# Patient Record
Sex: Male | Born: 1975 | Race: White | Hispanic: No | Marital: Single | Smoking: Never smoker
Health system: Southern US, Community
[De-identification: ages and names within clinical notes are randomized; demographics above are authoritative.]

## PROBLEM LIST (undated history)

## (undated) DIAGNOSIS — Z789 Other specified health status: Secondary | ICD-10-CM

## (undated) HISTORY — PX: NO PAST SURGERIES: SHX2092

---

## 1998-09-07 ENCOUNTER — Emergency Department (HOSPITAL_COMMUNITY): Admission: EM | Admit: 1998-09-07 | Discharge: 1998-09-07 | Payer: Self-pay

## 1998-09-07 ENCOUNTER — Encounter: Payer: Self-pay | Admitting: Emergency Medicine

## 2002-03-18 ENCOUNTER — Emergency Department (HOSPITAL_COMMUNITY): Admission: EM | Admit: 2002-03-18 | Discharge: 2002-03-19 | Payer: Self-pay | Admitting: Emergency Medicine

## 2011-04-24 ENCOUNTER — Emergency Department (HOSPITAL_COMMUNITY)
Admission: EM | Admit: 2011-04-24 | Discharge: 2011-04-24 | Disposition: A | Discharge status: Home / Self Care | Payer: Self-pay | Attending: Emergency Medicine | Admitting: Emergency Medicine

## 2011-04-24 DIAGNOSIS — K029 Dental caries, unspecified: Secondary | ICD-10-CM | POA: Insufficient documentation

## 2011-04-24 DIAGNOSIS — I10 Essential (primary) hypertension: Secondary | ICD-10-CM | POA: Insufficient documentation

## 2011-04-24 DIAGNOSIS — R220 Localized swelling, mass and lump, head: Secondary | ICD-10-CM | POA: Insufficient documentation

## 2011-04-24 DIAGNOSIS — K089 Disorder of teeth and supporting structures, unspecified: Secondary | ICD-10-CM | POA: Insufficient documentation

## 2012-08-24 ENCOUNTER — Inpatient Hospital Stay (HOSPITAL_COMMUNITY)
Admission: EM | Admit: 2012-08-24 | Discharge: 2012-08-28 | DRG: 465 | Disposition: A | Discharge status: Rehabilitation Facility | Payer: No Typology Code available for payment source | Attending: Orthopaedic Surgery | Admitting: Orthopaedic Surgery

## 2012-08-24 ENCOUNTER — Emergency Department (HOSPITAL_COMMUNITY): Payer: No Typology Code available for payment source

## 2012-08-24 DIAGNOSIS — T07XXXA Unspecified multiple injuries, initial encounter: Secondary | ICD-10-CM

## 2012-08-24 DIAGNOSIS — S82201B Unspecified fracture of shaft of right tibia, initial encounter for open fracture type I or II: Secondary | ICD-10-CM

## 2012-08-24 DIAGNOSIS — S82209B Unspecified fracture of shaft of unspecified tibia, initial encounter for open fracture type I or II: Principal | ICD-10-CM | POA: Diagnosis present

## 2012-08-24 HISTORY — DX: Other specified health status: Z78.9

## 2012-08-24 LAB — CG4 I-STAT (LACTIC ACID): Lactic Acid, Venous: 3.1 mmol/L — ABNORMAL HIGH (ref 0.5–2.2)

## 2012-08-24 MED ORDER — ONDANSETRON HCL 4 MG/2ML IJ SOLN
4.0000 mg | Freq: Once | INTRAMUSCULAR | Status: AC
  Administered 2012-08-24: 4 mg via INTRAVENOUS

## 2012-08-24 MED ORDER — ONDANSETRON HCL 4 MG/2ML IJ SOLN
INTRAMUSCULAR | Status: AC
  Filled 2012-08-24: qty 2

## 2012-08-24 MED ORDER — FENTANYL CITRATE 0.05 MG/ML IJ SOLN
100.0000 ug | Freq: Once | INTRAMUSCULAR | Status: AC
  Administered 2012-08-24: 100 ug via INTRAVENOUS

## 2012-08-24 MED ORDER — SODIUM CHLORIDE 0.9 % IV BOLUS (SEPSIS)
1000.0000 mL | Freq: Once | INTRAVENOUS | Status: AC
  Administered 2012-08-24: 1000 mL via INTRAVENOUS

## 2012-08-24 MED ORDER — FENTANYL CITRATE 0.05 MG/ML IJ SOLN
INTRAMUSCULAR | Status: AC
  Filled 2012-08-24: qty 2
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
## 2012-08-25 ENCOUNTER — Emergency Department (HOSPITAL_COMMUNITY): Payer: No Typology Code available for payment source

## 2012-08-25 ENCOUNTER — Encounter (HOSPITAL_COMMUNITY): Payer: Self-pay | Admitting: Anesthesiology

## 2012-08-25 ENCOUNTER — Encounter (HOSPITAL_COMMUNITY): Payer: Self-pay | Admitting: Radiology

## 2012-08-25 ENCOUNTER — Emergency Department (HOSPITAL_COMMUNITY): Payer: No Typology Code available for payment source | Admitting: Anesthesiology

## 2012-08-25 ENCOUNTER — Encounter (HOSPITAL_COMMUNITY)
Admission: EM | Disposition: A | Discharge status: Rehabilitation Facility | Payer: Self-pay | Source: Home / Self Care | Attending: Orthopaedic Surgery

## 2012-08-25 DIAGNOSIS — S82201B Unspecified fracture of shaft of right tibia, initial encounter for open fracture type I or II: Secondary | ICD-10-CM

## 2012-08-25 HISTORY — PX: TIBIA IM NAIL INSERTION: SHX2516

## 2012-08-25 LAB — COMPREHENSIVE METABOLIC PANEL
ALT: 48 U/L (ref 0–53)
AST: 39 U/L — ABNORMAL HIGH (ref 0–37)
Albumin: 4.1 g/dL (ref 3.5–5.2)
Alkaline Phosphatase: 81 U/L (ref 39–117)
CO2: 26 mEq/L (ref 19–32)
Chloride: 99 mEq/L (ref 96–112)
GFR calc non Af Amer: 65 mL/min — ABNORMAL LOW (ref >90)
Potassium: 4 mEq/L (ref 3.5–5.1)
Sodium: 138 mEq/L (ref 135–145)
Total Bilirubin: 0.4 mg/dL (ref 0.3–1.2)

## 2012-08-25 LAB — POCT I-STAT, CHEM 8
BUN: 21 mg/dL (ref 6–23)
Calcium, Ion: 1.07 mmol/L — ABNORMAL LOW (ref 1.12–1.23)
Chloride: 101 mEq/L (ref 96–112)
Creatinine, Ser: 1.4 mg/dL — ABNORMAL HIGH (ref 0.50–1.35)
TCO2: 27 mmol/L (ref 0–100)

## 2012-08-25 LAB — CDS SEROLOGY

## 2012-08-25 LAB — CBC
MCH: 28.5 pg (ref 26.0–34.0)
MCHC: 32.8 g/dL (ref 30.0–36.0)
MCV: 86.8 fL (ref 78.0–100.0)
Platelets: 224 10*3/uL (ref 150–400)

## 2012-08-25 LAB — PROTIME-INR: Prothrombin Time: 12.6 seconds (ref 11.6–15.2)

## 2012-08-25 SURGERY — INSERTION, INTRAMEDULLARY ROD, TIBIA
Anesthesia: General | Site: Leg Lower | Laterality: Right | Wound class: Dirty or Infected

## 2012-08-25 MED ORDER — BUPIVACAINE HCL (PF) 0.5 % IJ SOLN
INTRAMUSCULAR | Status: AC
  Filled 2012-08-25: qty 30

## 2012-08-25 MED ORDER — NEOSTIGMINE METHYLSULFATE 1 MG/ML IJ SOLN
INTRAMUSCULAR | Status: DC | PRN
  Administered 2012-08-25: 5 mg via INTRAVENOUS

## 2012-08-25 MED ORDER — KETOROLAC TROMETHAMINE 30 MG/ML IJ SOLN
INTRAMUSCULAR | Status: AC
  Filled 2012-08-25: qty 1

## 2012-08-25 MED ORDER — HYDROMORPHONE HCL PF 1 MG/ML IJ SOLN
1.0000 mg | INTRAMUSCULAR | Status: DC | PRN
  Administered 2012-08-25: 1 mg via INTRAVENOUS

## 2012-08-25 MED ORDER — HYDROMORPHONE HCL PF 1 MG/ML IJ SOLN
INTRAMUSCULAR | Status: AC
  Filled 2012-08-25: qty 1

## 2012-08-25 MED ORDER — WARFARIN SODIUM 10 MG PO TABS
10.0000 mg | ORAL_TABLET | Freq: Once | ORAL | Status: DC
  Filled 2012-08-25: qty 1

## 2012-08-25 MED ORDER — SUCCINYLCHOLINE CHLORIDE 20 MG/ML IJ SOLN
INTRAMUSCULAR | Status: DC | PRN
  Administered 2012-08-25: 140 mg via INTRAVENOUS

## 2012-08-25 MED ORDER — FENTANYL CITRATE 0.05 MG/ML IJ SOLN
100.0000 ug | Freq: Once | INTRAMUSCULAR | Status: AC
  Administered 2012-08-25: 100 ug via INTRAVENOUS
  Filled 2012-08-25: qty 2

## 2012-08-25 MED ORDER — ONDANSETRON HCL 4 MG/2ML IJ SOLN: 4.0000 mg | Freq: Four times a day (QID) | INTRAMUSCULAR | Status: DC | PRN

## 2012-08-25 MED ORDER — IOHEXOL 300 MG/ML  SOLN
100.0000 mL | Freq: Once | INTRAMUSCULAR | Status: AC | PRN
  Administered 2012-08-25: 100 mL via INTRAVENOUS

## 2012-08-25 MED ORDER — FENTANYL CITRATE 0.05 MG/ML IJ SOLN
INTRAMUSCULAR | Status: DC | PRN
  Administered 2012-08-25: 50 ug via INTRAVENOUS
  Administered 2012-08-25 (×2): 100 ug via INTRAVENOUS

## 2012-08-25 MED ORDER — DEXTROSE 5 % IV SOLN
500.0000 mg | Freq: Four times a day (QID) | INTRAVENOUS | Status: DC | PRN
  Filled 2012-08-25: qty 5

## 2012-08-25 MED ORDER — TETANUS-DIPHTH-ACELL PERTUSSIS 5-2.5-18.5 LF-MCG/0.5 IM SUSP
0.5000 mL | Freq: Once | INTRAMUSCULAR | Status: AC
  Administered 2012-08-25: 0.5 mL via INTRAMUSCULAR
  Filled 2012-08-25: qty 0.5

## 2012-08-25 MED ORDER — ONDANSETRON HCL 4 MG/2ML IJ SOLN
INTRAMUSCULAR | Status: DC | PRN
  Administered 2012-08-25: 4 mg via INTRAVENOUS

## 2012-08-25 MED ORDER — SODIUM CHLORIDE 0.9 % IR SOLN: Status: DC | PRN

## 2012-08-25 MED ORDER — WARFARIN SODIUM 10 MG PO TABS
10.0000 mg | ORAL_TABLET | Freq: Once | ORAL | Status: AC
  Administered 2012-08-25: 10 mg via ORAL
  Filled 2012-08-25: qty 1

## 2012-08-25 MED ORDER — INFLUENZA VIRUS VACC SPLIT PF IM SUSP
0.5000 mL | INTRAMUSCULAR | Status: AC
  Filled 2012-08-25: qty 0.5

## 2012-08-25 MED ORDER — CEFAZOLIN SODIUM 1-5 GM-% IV SOLN
1.0000 g | Freq: Once | INTRAVENOUS | Status: AC
  Administered 2012-08-25: 1 g via INTRAVENOUS
  Filled 2012-08-25: qty 50

## 2012-08-25 MED ORDER — HYDROMORPHONE BOLUS VIA INFUSION: 1.0000 mg | INTRAVENOUS | Status: DC | PRN

## 2012-08-25 MED ORDER — DEXTROSE 5 % IV SOLN
INTRAVENOUS | Status: DC | PRN
  Administered 2012-08-25: 04:00:00 via INTRAVENOUS

## 2012-08-25 MED ORDER — METOCLOPRAMIDE HCL 5 MG/ML IJ SOLN: 5.0000 mg | Freq: Three times a day (TID) | INTRAMUSCULAR | Status: DC | PRN

## 2012-08-25 MED ORDER — CEFAZOLIN SODIUM-DEXTROSE 2-3 GM-% IV SOLR
2.0000 g | Freq: Four times a day (QID) | INTRAVENOUS | Status: AC
  Administered 2012-08-25 – 2012-08-27 (×8): 2 g via INTRAVENOUS
  Filled 2012-08-25 (×8): qty 50

## 2012-08-25 MED ORDER — KETOROLAC TROMETHAMINE 30 MG/ML IJ SOLN
30.0000 mg | Freq: Once | INTRAMUSCULAR | Status: AC
  Administered 2012-08-25: 30 mg via INTRAVENOUS

## 2012-08-25 MED ORDER — CEFAZOLIN SODIUM 1-5 GM-% IV SOLN
INTRAVENOUS | Status: AC
  Filled 2012-08-25: qty 100

## 2012-08-25 MED ORDER — METOCLOPRAMIDE HCL 10 MG PO TABS: 5.0000 mg | ORAL_TABLET | Freq: Three times a day (TID) | ORAL | Status: DC | PRN

## 2012-08-25 MED ORDER — LIDOCAINE HCL (CARDIAC) 20 MG/ML IV SOLN
INTRAVENOUS | Status: DC | PRN
  Administered 2012-08-25: 100 mg via INTRAVENOUS

## 2012-08-25 MED ORDER — WARFARIN VIDEO: 1.0000 | Freq: Once | Status: DC

## 2012-08-25 MED ORDER — HYDROMORPHONE HCL PF 1 MG/ML IJ SOLN: 0.5000 mg | INTRAMUSCULAR | Status: DC | PRN

## 2012-08-25 MED ORDER — BUPIVACAINE-EPINEPHRINE 0.5% -1:200000 IJ SOLN
INTRAMUSCULAR | Status: DC | PRN
  Administered 2012-08-25: 7 mL

## 2012-08-25 MED ORDER — DOCUSATE SODIUM 100 MG PO CAPS
100.0000 mg | ORAL_CAPSULE | Freq: Two times a day (BID) | ORAL | Status: DC
  Administered 2012-08-25 – 2012-08-28 (×7): 100 mg via ORAL
  Filled 2012-08-25 (×9): qty 1

## 2012-08-25 MED ORDER — GLYCOPYRROLATE 0.2 MG/ML IJ SOLN
INTRAMUSCULAR | Status: DC | PRN
  Administered 2012-08-25: .6 mg via INTRAVENOUS

## 2012-08-25 MED ORDER — ROCURONIUM BROMIDE 100 MG/10ML IV SOLN
INTRAVENOUS | Status: DC | PRN
  Administered 2012-08-25: 20 mg via INTRAVENOUS
  Administered 2012-08-25: 10 mg via INTRAVENOUS
  Administered 2012-08-25: 50 mg via INTRAVENOUS

## 2012-08-25 MED ORDER — HYDROMORPHONE HCL PF 1 MG/ML IJ SOLN
0.2500 mg | INTRAMUSCULAR | Status: DC | PRN
  Administered 2012-08-25 (×3): 0.5 mg via INTRAVENOUS

## 2012-08-25 MED ORDER — CEFAZOLIN SODIUM 1-5 GM-% IV SOLN
INTRAVENOUS | Status: DC | PRN
  Administered 2012-08-25 (×2): 1 g via INTRAVENOUS

## 2012-08-25 MED ORDER — KCL IN DEXTROSE-NACL 20-5-0.45 MEQ/L-%-% IV SOLN
INTRAVENOUS | Status: DC
  Administered 2012-08-25 (×2): via INTRAVENOUS
  Filled 2012-08-25 (×10): qty 1000

## 2012-08-25 MED ORDER — PROPOFOL 10 MG/ML IV BOLUS
INTRAVENOUS | Status: DC | PRN
  Administered 2012-08-25: 250 mg via INTRAVENOUS

## 2012-08-25 MED ORDER — BIOTENE DRY MOUTH MT LIQD
15.0000 mL | Freq: Two times a day (BID) | OROMUCOSAL | Status: DC
  Administered 2012-08-25 – 2012-08-28 (×7): 15 mL via OROMUCOSAL
  Filled 2012-08-25: qty 15

## 2012-08-25 MED ORDER — ONDANSETRON HCL 4 MG PO TABS: 4.0000 mg | ORAL_TABLET | Freq: Four times a day (QID) | ORAL | Status: DC | PRN

## 2012-08-25 MED ORDER — LABETALOL HCL 5 MG/ML IV SOLN
INTRAVENOUS | Status: DC | PRN
  Administered 2012-08-25: 10 mg via INTRAVENOUS

## 2012-08-25 MED ORDER — OXYCODONE-ACETAMINOPHEN 5-325 MG PO TABS
1.0000 | ORAL_TABLET | ORAL | Status: DC | PRN
  Administered 2012-08-25 – 2012-08-26 (×4): 2 via ORAL
  Administered 2012-08-26: 1 via ORAL
  Administered 2012-08-26 – 2012-08-28 (×6): 2 via ORAL
  Administered 2012-08-28: 1 via ORAL
  Filled 2012-08-25 (×2): qty 2
  Filled 2012-08-25 (×2): qty 1
  Filled 2012-08-25 (×8): qty 2

## 2012-08-25 MED ORDER — LACTATED RINGERS IV SOLN
INTRAVENOUS | Status: DC | PRN
  Administered 2012-08-25 (×2): via INTRAVENOUS

## 2012-08-25 MED ORDER — WARFARIN - PHARMACIST DOSING INPATIENT: Freq: Every day | Status: DC

## 2012-08-25 MED ORDER — 0.9 % SODIUM CHLORIDE (POUR BTL) OPTIME
TOPICAL | Status: DC | PRN
  Administered 2012-08-25: 1000 mL

## 2012-08-25 MED ORDER — COUMADIN BOOK
1.0000 | Freq: Once | Status: AC
  Administered 2012-08-25: 1
  Filled 2012-08-25: qty 1

## 2012-08-25 MED ORDER — METHOCARBAMOL 500 MG PO TABS
500.0000 mg | ORAL_TABLET | Freq: Four times a day (QID) | ORAL | Status: DC | PRN
  Administered 2012-08-26 – 2012-08-27 (×2): 500 mg via ORAL
  Filled 2012-08-25 (×2): qty 1

## 2012-08-25 SURGICAL SUPPLY — 66 items
BANDAGE ELASTIC 4 VELCRO ST LF (GAUZE/BANDAGES/DRESSINGS) ×2 IMPLANT
BANDAGE ELASTIC 6 VELCRO ST LF (GAUZE/BANDAGES/DRESSINGS) ×2 IMPLANT
BANDAGE ESMARK 6X9 LF (GAUZE/BANDAGES/DRESSINGS) IMPLANT
BANDAGE GAUZE ELAST BULKY 4 IN (GAUZE/BANDAGES/DRESSINGS) ×2 IMPLANT
BIT DRILL 3.8X6 NS (BIT) ×1 IMPLANT
BIT DRILL 4.4 NS (BIT) ×1 IMPLANT
BLADE SURG 15 STRL LF DISP TIS (BLADE) ×1 IMPLANT
BLADE SURG 15 STRL SS (BLADE)
BLADE SURG ROTATE 9660 (MISCELLANEOUS) ×1 IMPLANT
BNDG CMPR 9X6 STRL LF SNTH (GAUZE/BANDAGES/DRESSINGS)
BNDG COHESIVE 6X5 TAN STRL LF (GAUZE/BANDAGES/DRESSINGS) ×2 IMPLANT
BNDG ESMARK 6X9 LF (GAUZE/BANDAGES/DRESSINGS)
CLOTH BEACON ORANGE TIMEOUT ST (SAFETY) ×2 IMPLANT
COTTON STERILE ROLL (GAUZE/BANDAGES/DRESSINGS) ×1 IMPLANT
COVER SURGICAL LIGHT HANDLE (MISCELLANEOUS) ×3 IMPLANT
CUFF TOURNIQUET SINGLE 34IN LL (TOURNIQUET CUFF) ×1 IMPLANT
CUFF TOURNIQUET SINGLE 44IN (TOURNIQUET CUFF) ×1 IMPLANT
DRAPE C-ARM 42X72 X-RAY (DRAPES) ×2 IMPLANT
DRAPE C-ARMOR (DRAPES) ×1 IMPLANT
DRAPE ORTHO SPLIT 77X108 STRL (DRAPES) ×4
DRAPE PROXIMA HALF (DRAPES) ×3 IMPLANT
DRAPE SURG ORHT 6 SPLT 77X108 (DRAPES) ×2 IMPLANT
DRAPE U-SHAPE 47X51 STRL (DRAPES) ×2 IMPLANT
DURAPREP 26ML APPLICATOR (WOUND CARE) ×3 IMPLANT
ELECT REM PT RETURN 9FT ADLT (ELECTROSURGICAL) ×2
ELECTRODE REM PT RTRN 9FT ADLT (ELECTROSURGICAL) ×1 IMPLANT
FACESHIELD LNG OPTICON STERILE (SAFETY) IMPLANT
GAUZE XEROFORM 1X8 LF (GAUZE/BANDAGES/DRESSINGS) ×2 IMPLANT
GAUZE XEROFORM 5X9 LF (GAUZE/BANDAGES/DRESSINGS) ×2 IMPLANT
GLOVE BIO SURGEON STRL SZ8 (GLOVE) ×2 IMPLANT
GLOVE BIOGEL PI IND STRL 7.5 (GLOVE) ×1 IMPLANT
GLOVE BIOGEL PI IND STRL 8 (GLOVE) ×1 IMPLANT
GLOVE BIOGEL PI INDICATOR 7.5 (GLOVE)
GLOVE BIOGEL PI INDICATOR 8 (GLOVE) ×2
GLOVE ECLIPSE 7.0 STRL STRAW (GLOVE) ×1 IMPLANT
GLOVE ORTHO TXT STRL SZ7.5 (GLOVE) ×4 IMPLANT
GOWN PREVENTION PLUS LG XLONG (DISPOSABLE) IMPLANT
GOWN PREVENTION PLUS XLARGE (GOWN DISPOSABLE) ×3 IMPLANT
GOWN STRL NON-REIN LRG LVL3 (GOWN DISPOSABLE) ×2 IMPLANT
GUIDEWIRE BALL NOSE 80CM (WIRE) ×3 IMPLANT
KIT BASIN OR (CUSTOM PROCEDURE TRAY) ×2 IMPLANT
KIT ROOM TURNOVER OR (KITS) ×2 IMPLANT
MANIFOLD NEPTUNE II (INSTRUMENTS) ×2 IMPLANT
NAIL TIBIAL 9MMX37.5CM (Nail) ×1 IMPLANT
NEEDLE HYPO 22GX1.5 SAFETY (NEEDLE) ×1 IMPLANT
NS IRRIG 1000ML POUR BTL (IV SOLUTION) ×2 IMPLANT
PACK GENERAL/GYN (CUSTOM PROCEDURE TRAY) ×2 IMPLANT
PAD ARMBOARD 7.5X6 YLW CONV (MISCELLANEOUS) ×4 IMPLANT
PADDING CAST COTTON 6X4 STRL (CAST SUPPLIES) ×1 IMPLANT
PIN GUIDE ACE (PIN) ×1 IMPLANT
SCREW ACECAP 48MM (Screw) ×1 IMPLANT
SCREW PROXIMAL DEPUY (Screw) ×6 IMPLANT
SCREW PRXML FT 55X5.5XNS TIB (Screw) ×1 IMPLANT
SCREW PRXML FT 60X5.5XNS LF (Screw) IMPLANT
SPONGE GAUZE 4X4 12PLY (GAUZE/BANDAGES/DRESSINGS) ×3 IMPLANT
STAPLER VISISTAT 35W (STAPLE) ×2 IMPLANT
STOCKINETTE IMPERVIOUS LG (DRAPES) ×2 IMPLANT
SUT VIC AB 0 CT1 27 (SUTURE)
SUT VIC AB 0 CT1 27XBRD ANBCTR (SUTURE) ×1 IMPLANT
SUT VIC AB 2-0 CT1 27 (SUTURE) ×2
SUT VIC AB 2-0 CT1 TAPERPNT 27 (SUTURE) ×1 IMPLANT
SYR CONTROL 10ML LL (SYRINGE) ×1 IMPLANT
TOWEL OR 17X24 6PK STRL BLUE (TOWEL DISPOSABLE) ×2 IMPLANT
TOWEL OR 17X26 10 PK STRL BLUE (TOWEL DISPOSABLE) ×2 IMPLANT
TRAY FOLEY CATH 14FR (SET/KITS/TRAYS/PACK) ×1 IMPLANT
WATER STERILE IRR 1000ML POUR (IV SOLUTION) ×3 IMPLANT

## 2012-08-25 NOTE — Anesthesia Preprocedure Evaluation (Addendum)
Anesthesia Evaluation  Patient identified by MRN, date of birth, ID band Patient awake  General Assessment Comment:Patient sedated in ER responding to questions. Denies any medical problems  Reviewed: Allergy & Precautions, H&P , NPO status , Patient's Chart, lab work & pertinent test results  Airway Mallampati: II TM Distance: >3 FB Neck ROM: Full    Dental  (+) Teeth Intact, Dental Advisory Given and Missing   Pulmonary  breath sounds clear to auscultation        Cardiovascular Rhythm:Regular Rate:Normal     Neuro/Psych    GI/Hepatic negative GI ROS, Neg liver ROS,   Endo/Other  negative endocrine ROS  Renal/GU negative Renal ROS     Musculoskeletal   Abdominal   Peds  Hematology   Anesthesia Other Findings   Reproductive/Obstetrics                         Anesthesia Physical Anesthesia Plan  ASA: III and emergent  Anesthesia Plan: General   Post-op Pain Management:    Induction: Intravenous  Airway Management Planned: Oral ETT  Additional Equipment:   Intra-op Plan:   Post-operative Plan: Possible Post-op intubation/ventilation  Informed Consent: I have reviewed the patients History and Physical, chart, labs and discussed the procedure including the risks, benefits and alternatives for the proposed anesthesia with the patient or authorized representative who has indicated his/her understanding and acceptance.   Dental advisory given  Plan Discussed with: CRNA and Anesthesiologist  Anesthesia Plan Comments:       Anesthesia Quick Evaluation

## 2012-08-25 NOTE — Anesthesia Procedure Notes (Signed)
Procedure Name: Intubation Date/Time: 08/25/2012 3:33 AM Performed by: Ellieanna Funderburg S Pre-anesthesia Checklist: Patient identified, Timeout performed, Suction available, Emergency Drugs available and Patient being monitored Patient Re-evaluated:Patient Re-evaluated prior to inductionOxygen Delivery Method: Circle system utilized Preoxygenation: Pre-oxygenation with 100% oxygen Intubation Type: IV induction, Rapid sequence and Cricoid Pressure applied Ventilation: Mask ventilation without difficulty Grade View: Grade I Tube type: Oral Tube size: 7.5 mm Number of attempts: 1 Airway Equipment and Method: Video-laryngoscopy Placement Confirmation: ETT inserted through vocal cords under direct vision,  breath sounds checked- equal and bilateral and positive ETCO2 Secured at: 22 cm Tube secured with: Tape Dental Injury: Teeth and Oropharynx as per pre-operative assessment
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
## 2012-08-25 NOTE — Transfer of Care (Signed)
Immediate Anesthesia Transfer of Care Note  Patient: [name]  Procedure(s) Performed: Procedure(s) (LRB) with comments: INTRAMEDULLARY (IM) NAIL TIBIAL (Right)  Patient Location: PACU  Anesthesia Type:General  Level of Consciousness: awake and alert   Airway & Oxygen Therapy: Patient Spontanous Breathing and Patient connected to nasal cannula oxygen  Post-op Assessment: Report given to PACU RN and Post -op Vital signs reviewed and stable  Post vital signs: Reviewed and stable  Complications: No apparent anesthesia complications
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
## 2012-08-25 NOTE — Progress Notes (Signed)
Subjective: Day of Surgery Procedure(s) (LRB): INTRAMEDULLARY (IM) NAIL TIBIAL (Right) Patient reports pain as moderate.  However pt resting comfortably Very drowsy, only awakened for a few seconds  Objective: Vital signs in last 24 hours: Temp:  [97.1 F (36.2 C)-98.1 F (36.7 C)] 97.8 F (36.6 C) (12/16 0653) Pulse Rate:  [84-102] 90  (12/16 0653) Resp:  [13-40] 20  (12/16 0653) BP: (125-170)/(84-110) 149/94 mmHg (12/16 0653) SpO2:  [97 %-100 %] 97 % (12/16 0653) Weight:  [124.739 kg (275 lb)-161.344 kg (355 lb 11.2 oz)] 161.344 kg (355 lb 11.2 oz) (12/16 0653)  Intake/Output from previous day: 12/15 0701 - 12/16 0700 In: 1850 [I.V.:1850] Out: 800 [Urine:700; Blood:100] Intake/Output this shift:     Basename 08/24/12 2357 08/24/12 2343  HGB 15.6 14.7    Basename 08/24/12 2357 08/24/12 2343  WBC -- 10.9*  RBC -- 5.16  HCT 46.0 44.8  PLT -- 224    Basename 08/24/12 2357 08/24/12 2343  NA 139 138  K 4.0 4.0  CL 101 99  CO2 -- 26  BUN 21 20  CREATININE 1.40* 1.38*  GLUCOSE 118* 123*  CALCIUM -- 9.4    Basename 08/24/12 2343  LABPT --  INR 0.95    cap refill and sensation of toes intact.  Assessment/Plan: Day of Surgery Procedure(s) (LRB): INTRAMEDULLARY (IM) NAIL TIBIAL (Right) Up with therapy for ambulation later today when more awake  Kamryn Messineo M 08/25/2012, 8:11 AM

## 2012-08-25 NOTE — Anesthesia Postprocedure Evaluation (Signed)
  Anesthesia Post-op Note  Patient: [name]  Procedure(s) Performed: Procedure(s) (LRB) with comments: INTRAMEDULLARY (IM) NAIL TIBIAL (Right)  Patient Location: PACU  Anesthesia Type:General  Level of Consciousness: awake  Airway and Oxygen Therapy: Patient Spontanous Breathing  Post-op Pain: mild  Post-op Assessment: Post-op Vital signs reviewed  Post-op Vital Signs: Reviewed  Complications: No apparent anesthesia complications
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
## 2012-08-25 NOTE — Preoperative (Signed)
Beta Blockers   Reason not to administer Beta Blockers:Not Applicable 
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
## 2012-08-25 NOTE — Brief Op Note (Signed)
08/24/2012 - 08/25/2012  5:43 AM  PATIENT:  [name]  36 y.o. male  PRE-OPERATIVE DIAGNOSIS:  Fractured Right Tibia  POST-OPERATIVE DIAGNOSIS:  fractured right tibia  PROCEDURE:  Procedure(s) (LRB) with comments: INTRAMEDULLARY (IM) NAIL TIBIAL (Right) with interlock biomet versa nail 9 mm times 37.5cm  SURGEON:  Surgeon(s) and Role:    * Eldred Manges, MD - Primary  PHYSICIAN ASSISTANT:   ASSISTANTS: none   ANESTHESIA:   local and general  EBL:  Total I/O In: 1850 [I.V.:1850] Out: 800 [Urine:700; Blood:100]  BLOOD ADMINISTERED:none  DRAINS: none   LOCAL MEDICATIONS USED:  MARCAINE     SPECIMEN:  No Specimen  DISPOSITION OF SPECIMEN:  N/A  COUNTS:  YES  TOURNIQUET:  * Missing tourniquet times found for documented tourniquets in log:  [id number] *  DICTATION: .Dragon Dictation and Other Dictation: Dictation Number [id number]  PLAN OF CARE: Admit to inpatient   PATIENT DISPOSITION:  PACU - hemodynamically stable.   Delay start of Pharmacological VTE agent (>24hrs) due to surgical blood loss or risk of bleeding: yes

## 2012-08-25 NOTE — ED Notes (Signed)
Report called to OR RN.
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
## 2012-08-26 ENCOUNTER — Encounter (HOSPITAL_COMMUNITY): Payer: Self-pay | Admitting: Orthopaedic Surgery

## 2012-08-26 LAB — HEMOGLOBIN AND HEMATOCRIT, BLOOD: Hemoglobin: 11.6 g/dL — ABNORMAL LOW (ref 13.0–17.0)

## 2012-08-26 LAB — PROTIME-INR: Prothrombin Time: 14.5 seconds (ref 11.6–15.2)

## 2012-08-26 MED ORDER — WARFARIN SODIUM 10 MG PO TABS
10.0000 mg | ORAL_TABLET | Freq: Once | ORAL | Status: AC
  Administered 2012-08-26: 10 mg via ORAL
  Filled 2012-08-26: qty 1

## 2012-08-26 NOTE — Clinical Social Work Placement (Signed)
Clinical Social Work Department  CLINICAL SOCIAL WORK PLACEMENT NOTE  08/26/12  Patient:  Account Number:   Admit date: 08/25/12  Clinical Social Worker: Sabino Niemann LCSWA Date/time: 08/26/2012 3:30 AM  Clinical Social Work is seeking post-discharge placement for this patient at the following level of care: SKILLED NURSING (*CSW will update this form in Epic as items are completed)  08/26/2012 Patient/family provided with [facility] Department of Clinical Social Work's list of facilities offering this level of care within the geographic area requested by the patient (or if unable, by the patient's family).  08/26/2012 Patient/family informed of their freedom to choose among providers that offer the needed level of care, that participate in Medicare, Medicaid or managed care program needed by the patient, have an available bed and are willing to accept the patient.  12/17/2013Patient/family informed of [facility]' ownership interest in [facility], as well as of the fact that they are under no obligation to receive care at this facility.  PASARR submitted to EDS on  PASARR number received from EDS on  FL2 transmitted to all facilities in geographic area requested by pt/family on 08/26/2012- [facility] transmitted to all facilities within larger geographic area on  Patient informed that his/her managed care company has contracts with or will negotiate with certain facilities, including the following:  Patient/family informed of bed offers received:  Patient chooses bed at  Physician recommends and patient chooses bed at  Patient to be transferred to on  Patient to be transferred to facility by  The following physician request were entered in Epic:  Additional Comments:  Sabino Niemann, MSW, [phone]

## 2012-08-26 NOTE — Progress Notes (Signed)
ANTICOAGULATION CONSULT NOTE - Follow Up Consult  Pharmacy Consult for Coumadin Indication: VTE prophylaxis  No Known Allergies  Patient Measurements: Height: 6\' 2"  (188 cm) Weight: 355 lb 11.2 oz (161.344 kg) IBW/kg (Calculated) : 82.2  Heparin Dosing Weight:   Vital Signs: Temp: 98.2 F (36.8 C) (12/17 0618) BP: 124/68 mmHg (12/17 0618) Pulse Rate: 95  (12/17 0618)  Labs:  Basename 08/26/12 0528 08/24/12 2357 08/24/12 2343  HGB 11.6* 15.6 --  HCT 36.3* 46.0 44.8  PLT -- -- 224  APTT -- -- --  LABPROT 14.5 -- 12.6  INR 1.15 -- 0.95  HEPARINUNFRC -- -- --  CREATININE -- 1.40* 1.38*  CKTOTAL -- -- --  CKMB -- -- --  TROPONINI -- -- --    Estimated Creatinine Clearance: 118.5 ml/min (by C-G formula based on Cr of 1.4).  Assessment: 36yom on Coumadin for VTE prophylaxis s/p IM nail procedure. INR (1.15) is subtherapeutic as expected after Coumadin 10mg  x 1 - will repeat dose. - H/H decreased post op - monitor - No significant bleeding reported - Warfarin points: 9  Goal of Therapy:  INR 2-3   Plan:  1. Repeat Coumadin 10mg  po x 1 today 2. Follow-up AM INR  Cleon Dew [phone] 08/26/2012,9:47 AM
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
## 2012-08-26 NOTE — Clinical Social Work Psychosocial (Signed)
Clinical Social Work Department  BRIEF PSYCHOSOCIAL ASSESSMENT  Patient: [name]  Account Number: [id number]  Admit date: 08/25/12 Clinical Social Worker Date/Time:  Referred by: Physician Date Referred: 08/21/12 Referred for   SNF Placement   Other Referral:  Interview type: Patient  Other interview type: Patient's friend Lambert Mody [phone] PSYCHOSOCIAL DATA  Living Status: FAMILY  Admitted from facility:  Level of care:  Primary support name:Dwayne Church   Primary support relationship to patient: Friend- all family is deceased Degree of support available:  Strong and vested  CURRENT CONCERNS  Current Concerns   Post-Acute Placement   Other Concerns:  SOCIAL WORK ASSESSMENT / PLAN  CSW met with pt re: PT recommendation for SNF.   Pt lives at a boarding house in [place] and works at [employer].   CSW explained placement process and answered questions.   Pt's options are limited to [facility] nursing center as he has no insurance and does not qualify for medicaid at this time    CSW completed FL2 and initiated [facility] search.   Weekday CSW to f/u with offers.   Assessment/plan status: Information/Referral to [facility]  Other assessment/ plan:  Information/referral to community resources:  SNF      PATIENT'S/FAMILY'S RESPONSE TO PLAN OF CARE:  Pt   reports he is  agreeable to [facility] SNF in order to increase strength and independence with mobility prior to return home with her grandson. Pt verbalized understanding of placement process and appreciation for CSW assist.   Sabino Niemann, MSW [phone]

## 2012-08-26 NOTE — Progress Notes (Signed)
Subjective: 1 Day Post-Op Procedure(s) (LRB): INTRAMEDULLARY (IM) NAIL TIBIAL (Right) Patient reports pain as moderate.   Sleeps after pain meds  Objective: Vital signs in last 24 hours: Temp:  [97.9 F (36.6 C)-99 F (37.2 C)] 98.2 F (36.8 C) (12/17 0618) Pulse Rate:  [90-103] 95  (12/17 0618) Resp:  [16-18] 18  (12/17 0618) BP: (123-131)/(68-80) 124/68 mmHg (12/17 0618) SpO2:  [96 %-100 %] 96 % (12/17 0618)  Intake/Output from previous day: 12/16 0701 - 12/17 0700 In: 2190 [P.O.:1040; I.V.:1050; IV Piggyback:100] Out: 700 [Urine:700] Intake/Output this shift:     Basename 08/26/12 0528 08/24/12 2357 08/24/12 2343  HGB 11.6* 15.6 14.7    Basename 08/26/12 0528 08/24/12 2357 08/24/12 2343  WBC -- -- 10.9*  RBC -- -- 5.16  HCT 36.3* 46.0 --  PLT -- -- 224    Basename 08/24/12 2357 08/24/12 2343  NA 139 138  K 4.0 4.0  CL 101 99  CO2 -- 26  BUN 21 20  CREATININE 1.40* 1.38*  GLUCOSE 118* 123*  CALCIUM -- 9.4    Basename 08/26/12 0528 08/24/12 2343  LABPT -- --  INR 1.15 0.95    Neurologically intact Compartment soft  Assessment/Plan: 1 Day Post-Op Procedure(s) (LRB): INTRAMEDULLARY (IM) NAIL TIBIAL (Right) Up with therapy  Possible home in a couple days, lives alone boarding house.  Will be out of work for a couple months.   Lorenza Winkleman C 08/26/2012, 7:34 AM
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
## 2012-08-26 NOTE — Progress Notes (Signed)
Rehab Admissions Coordinator Note:  Patient was screened by Trish Mage for appropriateness for an Inpatient Acute Rehab Consult.  At this time, we are recommending Inpatient Rehab consult.  I have talked with RN assigned to ask MD for order for rehab consult.  Trish Mage 08/26/2012, 4:25 PM  I can be reached at [phone].

## 2012-08-26 NOTE — Progress Notes (Signed)
UR COMPLETED  

## 2012-08-27 DIAGNOSIS — S82109A Unspecified fracture of upper end of unspecified tibia, initial encounter for closed fracture: Secondary | ICD-10-CM

## 2012-08-27 MED ORDER — MUPIROCIN 2 % EX OINT
TOPICAL_OINTMENT | Freq: Two times a day (BID) | CUTANEOUS | Status: DC
  Administered 2012-08-27 – 2012-08-28 (×2): via TOPICAL
  Filled 2012-08-27 (×2): qty 22

## 2012-08-27 MED ORDER — WARFARIN SODIUM 10 MG PO TABS
10.0000 mg | ORAL_TABLET | Freq: Once | ORAL | Status: DC
  Administered 2012-08-27: 10 mg via ORAL
  Filled 2012-08-27: qty 1

## 2012-08-27 NOTE — Progress Notes (Signed)
ANTICOAGULATION CONSULT NOTE - Follow Up Consult  Pharmacy Consult for Coumadin Indication: VTE prophylaxis  No Known Allergies  Patient Measurements: Height: 6\' 2"  (188 cm) Weight: 355 lb 11.2 oz (161.344 kg) IBW/kg (Calculated) : 82.2   Vital Signs: Temp: 97.2 F (36.2 C) (12/18 0600) BP: 129/82 mmHg (12/18 0600) Pulse Rate: 85  (12/18 0600)  Labs:  Basename 08/27/12 0625 08/26/12 0528 08/24/12 2357 08/24/12 2343  HGB -- 11.6* 15.6 --  HCT -- 36.3* 46.0 44.8  PLT -- -- -- 224  APTT -- -- -- --  LABPROT 15.5* 14.5 -- 12.6  INR 1.25 1.15 -- 0.95  HEPARINUNFRC -- -- -- --  CREATININE -- -- 1.40* 1.38*  CKTOTAL -- -- -- --  CKMB -- -- -- --  TROPONINI -- -- -- --   Estimated Creatinine Clearance: 118.5 ml/min (by C-G formula based on Cr of 1.4).  Assessment: 36 yo male on Coumadin for VTE prophylaxis s/p IM nail procedure. INR is subtherapeutic at 1.25 as expected after only 2 doses, but is trending up. H/H down post-op but no overt bleeding noted in chart.  Goal of Therapy:  INR 2-3   Plan:  1. Repeat Coumadin 10mg  PO x 1 today 2. Follow-up INR in AM 3. Monitor signs/symptoms of bleeding  Benjaman Pott, PharmD    08/27/2012   9:53 AM

## 2012-08-27 NOTE — Progress Notes (Signed)
Subjective: 2 Days Post-Op Procedure(s) (LRB): INTRAMEDULLARY (IM) NAIL TIBIAL (Right) Patient reports pain as moderate.    Objective: Vital signs in last 24 hours: Temp:  [97.2 F (36.2 C)-98.5 F (36.9 C)] 97.2 F (36.2 C) (12/18 0600) Pulse Rate:  [85-96] 85  (12/18 0600) Resp:  [19-20] 19  (12/18 0600) BP: (125-129)/(72-82) 129/82 mmHg (12/18 0600) SpO2:  [96 %] 96 % (12/18 0600)  Intake/Output from previous day: 12/17 0701 - 12/18 0700 In: 175 [P.O.:175] Out: -  Intake/Output this shift:     Basename 08/26/12 0528 08/24/12 2357 08/24/12 2343  HGB 11.6* 15.6 14.7    Basename 08/26/12 0528 08/24/12 2357 08/24/12 2343  WBC -- -- 10.9*  RBC -- -- 5.16  HCT 36.3* 46.0 --  PLT -- -- 224    Basename 08/24/12 2357 08/24/12 2343  NA 139 138  K 4.0 4.0  CL 101 99  CO2 -- 26  BUN 21 20  CREATININE 1.40* 1.38*  GLUCOSE 118* 123*  CALCIUM -- 9.4    Basename 08/27/12 0625 08/26/12 0528  LABPT -- --  INR 1.25 1.15    Neurovascular intact  Assessment/Plan: 2 Days Post-Op Procedure(s) (LRB): INTRAMEDULLARY (IM) NAIL TIBIAL (Right) Up with therapy Request for inpt rehab consult.  Will order.  If not candidate, pt will be ready for release to NH tomorrow.  Jaeven Wanzer M 08/27/2012, 8:26 AM
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
## 2012-08-27 NOTE — Progress Notes (Signed)
Orthopedic Tech Progress Note Patient Details:  [name] [birth year] [id number]  Ortho Devices Type of Ortho Device: Postop shoe/boot Ortho Device/Splint Location: RIGHT POST OP SHOE Ortho Device/Splint Interventions: Application   Cammer, Mickie Bail 08/27/2012, 10:56 AM

## 2012-08-27 NOTE — Progress Notes (Signed)
Physical Therapy Treatment Patient Details Name: [name] MRN: [id number] DOB: 08-Mar-1976 Today's Date: 08/27/2012 Time: 1191-4782 PT Time Calculation (min): 19 min  PT Assessment / Plan / Recommendation Comments on Treatment Session  Patient able to increase ambulation but having pain. Continue to recommentd CIR as patient could potentially reach Mod I level    Follow Up Recommendations  CIR;Supervision/Assistance - 24 hour     Does the patient have the potential to tolerate intense rehabilitation     Barriers to Discharge        Equipment Recommendations  Rolling walker with 5" wheels    Recommendations for Other Services Rehab consult  Frequency Min 5X/week   Plan Discharge plan remains appropriate;Frequency remains appropriate    Precautions / Restrictions Precautions Precautions: Fall Restrictions Weight Bearing Restrictions: Yes RLE Weight Bearing: Weight bearing as tolerated   Pertinent Vitals/Pain     Mobility  Bed Mobility Supine to Sit: 6: Modified independent (Device/Increase time) Sitting - Scoot to Edge of Bed: 6: Modified independent (Device/Increase time) Transfers Sit to Stand: 4: Min guard;With upper extremity assist Stand to Sit: 4: Min guard;Without upper extremity assist Ambulation/Gait Ambulation/Gait Assistance: 4: Min assist Ambulation Distance (Feet): 30 Feet Assistive device: Rolling walker Ambulation/Gait Assistance Details: Cues for RW and technique. A for stability Gait Pattern: Step-to pattern;Trunk flexed    Exercises     PT Diagnosis:    PT Problem List:   PT Treatment Interventions:     PT Goals Acute Rehab PT Goals PT Goal: Supine/Side to Sit - Progress: Met PT Goal: Sit to Stand - Progress: Progressing toward goal PT Goal: Stand to Sit - Progress: Progressing toward goal PT Transfer Goal: Bed to Chair/Chair to Bed - Progress: Progressing toward goal PT Goal: Ambulate - Progress: Progressing toward goal  Visit  Information  Last PT Received On: 08/27/12 Assistance Needed: +1    Subjective Data      Cognition  Overall Cognitive Status: Appears within functional limits for tasks assessed/performed Arousal/Alertness: Awake/alert Orientation Level: Appears intact for tasks assessed Behavior During Session: [facility] for tasks performed    Balance  Static Standing Balance Static Standing - Balance Support: Left upper extremity supported Static Standing - Level of Assistance: 5: Stand by assistance Static Standing - Comment/# of Minutes: 4 minutes with cues for positoning  End of Session PT - End of Session Equipment Utilized During Treatment: Gait belt Activity Tolerance: Patient tolerated treatment well Patient left: in chair;with call bell/phone within reach Nurse Communication: Mobility status   GP     Fredrich Birks 08/27/2012, 10:56 AM 08/27/2012 Fredrich Birks PTA [phone] pager [phone] office

## 2012-08-27 NOTE — Progress Notes (Signed)
There is no bed availability at [facility] and will not be any availability this week according to the admissions coordinator.  Sabino Niemann, MSW [phone]

## 2012-08-28 ENCOUNTER — Inpatient Hospital Stay (HOSPITAL_COMMUNITY)
Admission: RE | Admit: 2012-08-28 | Discharge: 2012-09-02 | DRG: 945 | Disposition: A | Discharge status: Home Health | Payer: No Typology Code available for payment source | Source: Ambulatory Visit | Attending: Physical Medicine & Rehabilitation | Admitting: Physical Medicine & Rehabilitation

## 2012-08-28 DIAGNOSIS — S82209A Unspecified fracture of shaft of unspecified tibia, initial encounter for closed fracture: Secondary | ICD-10-CM

## 2012-08-28 DIAGNOSIS — Z5189 Encounter for other specified aftercare: Principal | ICD-10-CM

## 2012-08-28 DIAGNOSIS — R6250 Unspecified lack of expected normal physiological development in childhood: Secondary | ICD-10-CM | POA: Diagnosis present

## 2012-08-28 DIAGNOSIS — S82109B Unspecified fracture of upper end of unspecified tibia, initial encounter for open fracture type I or II: Secondary | ICD-10-CM

## 2012-08-28 DIAGNOSIS — S82209B Unspecified fracture of shaft of unspecified tibia, initial encounter for open fracture type I or II: Secondary | ICD-10-CM | POA: Diagnosis present

## 2012-08-28 DIAGNOSIS — S069X9A Unspecified intracranial injury with loss of consciousness of unspecified duration, initial encounter: Secondary | ICD-10-CM

## 2012-08-28 MED ORDER — BIOTENE DRY MOUTH MT LIQD
15.0000 mL | Freq: Two times a day (BID) | OROMUCOSAL | Status: DC
  Administered 2012-08-28 – 2012-09-02 (×7): 15 mL via OROMUCOSAL

## 2012-08-28 MED ORDER — OXYCODONE-ACETAMINOPHEN 5-325 MG PO TABS
1.0000 | ORAL_TABLET | ORAL | Status: DC | PRN
  Administered 2012-08-28 – 2012-08-29 (×3): 1 via ORAL
  Administered 2012-08-29 – 2012-08-30 (×3): 2 via ORAL
  Administered 2012-08-30: 1 via ORAL
  Administered 2012-08-30 – 2012-09-01 (×7): 2 via ORAL
  Administered 2012-09-01: 1 via ORAL
  Administered 2012-09-02 (×2): 2 via ORAL
  Filled 2012-08-28 (×3): qty 2
  Filled 2012-08-28: qty 1
  Filled 2012-08-28 (×3): qty 2
  Filled 2012-08-28: qty 1
  Filled 2012-08-28 (×2): qty 2
  Filled 2012-08-28: qty 1
  Filled 2012-08-28 (×6): qty 2

## 2012-08-28 MED ORDER — WARFARIN SODIUM 10 MG PO TABS
10.0000 mg | ORAL_TABLET | Freq: Once | ORAL | Status: DC
  Filled 2012-08-28: qty 1

## 2012-08-28 MED ORDER — METHOCARBAMOL 500 MG PO TABS: 500.0000 mg | ORAL_TABLET | Freq: Four times a day (QID) | ORAL | Status: DC | PRN

## 2012-08-28 MED ORDER — SORBITOL 70 % SOLN: 30.0000 mL | Freq: Every day | Status: DC | PRN

## 2012-08-28 MED ORDER — WARFARIN VIDEO: 1.0000 | Freq: Once | Status: DC

## 2012-08-28 MED ORDER — WARFARIN SODIUM 7.5 MG PO TABS
15.0000 mg | ORAL_TABLET | Freq: Once | ORAL | Status: DC
  Filled 2012-08-28: qty 2

## 2012-08-28 MED ORDER — ACETAMINOPHEN 325 MG PO TABS: 325.0000 mg | ORAL_TABLET | ORAL | Status: DC | PRN

## 2012-08-28 MED ORDER — ONDANSETRON HCL 4 MG/2ML IJ SOLN: 4.0000 mg | Freq: Four times a day (QID) | INTRAMUSCULAR | Status: DC | PRN

## 2012-08-28 MED ORDER — WARFARIN SODIUM 7.5 MG PO TABS
15.0000 mg | ORAL_TABLET | Freq: Once | ORAL | Status: AC
  Administered 2012-08-28: 15 mg via ORAL
  Filled 2012-08-28: qty 2

## 2012-08-28 MED ORDER — ONDANSETRON HCL 4 MG PO TABS
4.0000 mg | ORAL_TABLET | Freq: Four times a day (QID) | ORAL | Status: DC | PRN
  Filled 2012-08-28: qty 1

## 2012-08-28 MED ORDER — WARFARIN - PHARMACIST DOSING INPATIENT
Freq: Every day | Status: DC
  Administered 2012-08-28 – 2012-08-30 (×3)

## 2012-08-28 MED ORDER — POLYETHYLENE GLYCOL 3350 17 G PO PACK
17.0000 g | PACK | Freq: Every day | ORAL | Status: DC | PRN
  Filled 2012-08-28 (×2): qty 1

## 2012-08-28 MED ORDER — MUPIROCIN 2 % EX OINT
TOPICAL_OINTMENT | Freq: Two times a day (BID) | CUTANEOUS | Status: DC
  Administered 2012-08-28 – 2012-08-29 (×3): via TOPICAL
  Administered 2012-08-30: 1 via TOPICAL
  Administered 2012-08-30 – 2012-09-01 (×4): via TOPICAL
  Filled 2012-08-28 (×2): qty 22
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
## 2012-08-28 NOTE — Progress Notes (Signed)
Patient information reviewed and entered into eRehab system by Vanellope Passmore, RN, CRRN, PPS Coordinator.  Information including medical coding and functional independence measure will be reviewed and updated through discharge.    

## 2012-08-28 NOTE — Progress Notes (Signed)
Subjective: 3 Days Post-Op Procedure(s) (LRB): INTRAMEDULLARY (IM) NAIL TIBIAL (Right) Patient reports pain as mild.    Objective: Vital signs in last 24 hours: Temp:  [97.6 F (36.4 C)-98.7 F (37.1 C)] 98.7 F (37.1 C) (12/19 0519) Pulse Rate:  [84-103] 84  (12/19 0519) Resp:  [16-18] 18  (12/19 0519) BP: (138-154)/(67-104) 150/67 mmHg (12/19 0519) SpO2:  [97 %-98 %] 97 % (12/19 0519)  Intake/Output from previous day: 12/18 0701 - 12/19 0700 In: 350 [P.O.:350] Out: 700 [Urine:700] Intake/Output this shift:     Basename 08/26/12 0528  HGB 11.6*    Basename 08/26/12 0528  WBC --  RBC --  HCT 36.3*  PLT --   No results found for this basename: NA:2,K:2,CL:2,CO2:2,BUN:2,CREATININE:2,GLUCOSE:2,CALCIUM:2 in the last 72 hours  Basename 08/28/12 0535 08/27/12 0625  LABPT -- --  INR 1.32 1.25    Neurovascular intact  Assessment/Plan: 3 Days Post-Op Procedure(s) (LRB): INTRAMEDULLARY (IM) NAIL TIBIAL (Right) Up with therapy If pt candidate for Rehab could transfer today.  Please let me know at [phone]   Wende Neighbors 08/28/2012, 10:26 AM
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
## 2012-08-28 NOTE — Progress Notes (Signed)
Physical Therapy Treatment Patient Details Name: [name] MRN: [id number] DOB: 01/17/1976 Today's Date: 08/28/2012 Time: 4098-1191 PT Time Calculation (min): 15 min  PT Assessment / Plan / Recommendation Comments on Treatment Session  Patient progressing well with therapy. Will attempt steps next session    Follow Up Recommendations  CIR     Does the patient have the potential to tolerate intense rehabilitation     Barriers to Discharge        Equipment Recommendations  Rolling walker with 5" wheels    Recommendations for Other Services    Frequency Min 5X/week   Plan Discharge plan remains appropriate;Frequency remains appropriate    Precautions / Restrictions Precautions Precautions: Fall Restrictions RLE Weight Bearing: Weight bearing as tolerated   Pertinent Vitals/Pain     Mobility  Bed Mobility Bed Mobility: Not assessed Transfers Sit to Stand: 4: Min guard;With upper extremity assist;From chair/3-in-1 Stand to Sit: 4: Min guard;With upper extremity assist;To chair/3-in-1 Details for Transfer Assistance: Cues for safe technique Ambulation/Gait Ambulation/Gait Assistance: 4: Min guard Ambulation Distance (Feet): 200 Feet Assistive device: Rolling walker Ambulation/Gait Assistance Details: Cues for safety with RW. Required 2 standing rest breaks Gait Pattern: Step-to pattern;Antalgic    Exercises     PT Diagnosis:    PT Problem List:   PT Treatment Interventions:     PT Goals Acute Rehab PT Goals PT Goal: Sit to Stand - Progress: Progressing toward goal PT Goal: Stand to Sit - Progress: Progressing toward goal PT Transfer Goal: Bed to Chair/Chair to Bed - Progress: Progressing toward goal PT Goal: Ambulate - Progress: Progressing toward goal  Visit Information  Last PT Received On: 08/28/12 Assistance Needed: +1    Subjective Data      Cognition  Overall Cognitive Status: Appears within functional limits for tasks  assessed/performed Arousal/Alertness: Awake/alert Orientation Level: Appears intact for tasks assessed Behavior During Session: [facility] for tasks performed    Balance     End of Session PT - End of Session Equipment Utilized During Treatment: Gait belt Activity Tolerance: Patient tolerated treatment well Patient left: in chair;with call bell/phone within reach Nurse Communication: Mobility status   GP     Fredrich Birks 08/28/2012, 11:59 AM  08/28/2012 Fredrich Birks PTA [phone] pager [phone] office

## 2012-08-28 NOTE — Progress Notes (Signed)
Rehab admissions - Evaluated for possible admission.  I spoke with patient and gave him inpatient rehab booklets.  He is agreeable to inpatient rehab here at [facility].  Bed available and can admit to inpatient rehab today.  Call me for questions.  #[phone]

## 2012-08-28 NOTE — Progress Notes (Signed)
Pt transferred to Rehab from [facility]. Orientated to Rehab expectations, schedule, visiting hours, etc. Diagnosis specific handout/ information provided. Pt's butterfly wish is to get better, and go home.

## 2012-08-28 NOTE — Progress Notes (Signed)
Patient was accepted at [facility] and will not be going to SNF. Clinical Social Worker will sign off for now as social work intervention is no longer needed. Please consult [place] again if new need arises.   Sabino Niemann, MSW, [employer] [phone]

## 2012-08-29 ENCOUNTER — Inpatient Hospital Stay (HOSPITAL_COMMUNITY): Payer: No Typology Code available for payment source | Admitting: Speech Pathology

## 2012-08-29 ENCOUNTER — Inpatient Hospital Stay (HOSPITAL_COMMUNITY): Payer: No Typology Code available for payment source | Admitting: *Deleted

## 2012-08-29 ENCOUNTER — Inpatient Hospital Stay (HOSPITAL_COMMUNITY): Payer: No Typology Code available for payment source | Admitting: Physical Therapy

## 2012-08-29 DIAGNOSIS — S82109A Unspecified fracture of upper end of unspecified tibia, initial encounter for closed fracture: Secondary | ICD-10-CM

## 2012-08-29 LAB — COMPREHENSIVE METABOLIC PANEL
Albumin: 3.1 g/dL — ABNORMAL LOW (ref 3.5–5.2)
BUN: 15 mg/dL (ref 6–23)
Calcium: 9 mg/dL (ref 8.4–10.5)
Chloride: 99 mEq/L (ref 96–112)
Creatinine, Ser: 0.97 mg/dL (ref 0.50–1.35)
GFR calc non Af Amer: >90 mL/min (ref >90)
Total Bilirubin: 0.5 mg/dL (ref 0.3–1.2)

## 2012-08-29 LAB — CBC WITH DIFFERENTIAL/PLATELET
Basophils Relative: 0 % (ref 0–1)
Eosinophils Absolute: 0.4 10*3/uL (ref 0.0–0.7)
Eosinophils Relative: 4 % (ref 0–5)
HCT: 35.2 % — ABNORMAL LOW (ref 39.0–52.0)
Hemoglobin: 11.1 g/dL — ABNORMAL LOW (ref 13.0–17.0)
MCH: 27.6 pg (ref 26.0–34.0)
MCHC: 31.5 g/dL (ref 30.0–36.0)
MCV: 87.6 fL (ref 78.0–100.0)
Monocytes Absolute: 0.8 10*3/uL (ref 0.1–1.0)
Monocytes Relative: 9 % (ref 3–12)
Neutro Abs: 6.7 10*3/uL (ref 1.7–7.7)
RDW: 13.3 % (ref 11.5–15.5)

## 2012-08-29 MED ORDER — WARFARIN SODIUM 7.5 MG PO TABS
15.0000 mg | ORAL_TABLET | Freq: Once | ORAL | Status: AC
  Administered 2012-08-29: 15 mg via ORAL
  Filled 2012-08-29: qty 2
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
## 2012-08-29 NOTE — Progress Notes (Signed)
Occupational Therapy Note  Patient Details  Name: [name] MRN: [id number] Date of Birth: 1976-05-31 Today's Date: 08/29/2012 1300-1330  (30 min)   Pain:  4/10 progressing to 8/10 with mobility Individual session  Engaged in toileting, toilet transfer, and functional mobility.  Pt ambulated to bathroom with RW plus close supervision.  Pt. Ambulated to elevators and needed to rest.  Practiced wc mobility the remainder of the the session.     Humberto Seals 08/29/2012, 2:00 PM
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
## 2012-08-30 ENCOUNTER — Inpatient Hospital Stay (HOSPITAL_COMMUNITY): Payer: No Typology Code available for payment source | Admitting: Physical Therapy

## 2012-08-30 ENCOUNTER — Inpatient Hospital Stay (HOSPITAL_COMMUNITY): Payer: No Typology Code available for payment source

## 2012-08-30 ENCOUNTER — Inpatient Hospital Stay (HOSPITAL_COMMUNITY): Payer: Self-pay

## 2012-08-30 LAB — PROTIME-INR
INR: 1.77 — ABNORMAL HIGH (ref 0.00–1.49)
Prothrombin Time: 20 seconds — ABNORMAL HIGH (ref 11.6–15.2)

## 2012-08-30 MED ORDER — WARFARIN SODIUM 7.5 MG PO TABS
15.0000 mg | ORAL_TABLET | ORAL | Status: AC
  Administered 2012-08-30: 15 mg via ORAL
  Filled 2012-08-30: qty 2
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
## 2012-08-30 NOTE — Plan of Care (Signed)
Problem: RH BOWEL ELIMINATION Goal: RH STG MANAGE BOWEL WITH ASSISTANCE STG Manage Bowel with Assistance. Mod I  Outcome: Progressing Laxative given  Problem: RH SKIN INTEGRITY Goal: RH STG SKIN FREE OF INFECTION/BREAKDOWN Skin free of infection/breakdown with minimal assistance  Outcome: Not Progressing Two blisters to buttocks; BC applied; reinforcement of importance of turning and repositioning encouraged

## 2012-08-30 NOTE — Progress Notes (Signed)
Physical Therapy Session Note  Patient Details  Name: [name] MRN: [id number] Date of Birth: [birth year]  Today's Date: 08/30/2012 Time: 0730-0830 Time Calculation (min): 60 min  Therapy Documentation Precautions:  Precautions: Fall Weight Bearing Restrictions: Yes RLE Weight Bearing: Weight bearing as tolerated with post-op shoe Pain: 8/10 nursing notified and patient given medications   Therapeutic Activity:(15') Transfer training sit<->stand S/Mod-I, bed mobility supine <-> sit with S/Mod-I and scoot to [facility] Therapeutic Exercise:(30') B LE's in supine, Nu-Step x 11 minutes at level 4 with cool down Gait Training:(15') using bariatric RW and post-op shoe on R LE, (R LE WBAT) x 250' S/Mod-I, and use of manual w/c x 250' for generalized strengthening  Therapy/Group: Individual Therapy  Rex Kras 08/30/2012, 7:36 AM

## 2012-08-30 NOTE — Progress Notes (Signed)
Occupational Therapy Session Note  Patient Details  Name: [name] MRN: [id number] Date of Birth: 10/24/1975  Today's Date: 08/30/2012 Time: 0905-1000 Time Calculation (min): 55 min  Short Term Goals: Week 1:     Skilled Therapeutic Interventions:  ADL-retraining at walk-in shower with emphasis on safe and effective use of DME, organizing and sequencing activity, endurance, and sitting and standing dynamic balance.  OT noted small skin tears at buttocks (type I); RN notified and applied appropriate cream barrier.   Patient educated on need to stand to shift wear and prevent shear/friction.  Good thoroughness with ADL.      Therapy Documentation Precautions:  Precautions Precautions: Fall Restrictions Weight Bearing Restrictions: Yes RLE Weight Bearing: Weight bearing as tolerated  Pain: Pain Assessment Pain Score: 0-No pain Faces Pain Scale: No hurt  See FIM for current functional status  Therapy/Group: Individual Therapy  Second session: Time:1300-1330 Time Calculation (min): 30 min  Pain Assessment: 9/10 at right LE, RN notified (meds dispensed)  Skilled Therapeutic Interventions: Therapeutic activities with emphasis on IADL (homemaking, laundry) and tub transfer at ADL apartment standard tub with bench.   Patient voiced c/o pain but managed to ambulate to RN station and calmly request meds, using RW w/OT escort.   Patient then ambulated to laundry and began his wash (small amount of clothing) as instructed.   Patient completed tub bench transfer into standard tub with only 1 demonstration and verbal cues for safety.  See FIM for current functional status  Therapy/Group: Individual Therapy   Georgeanne Nim 08/30/2012, 12:37 PM

## 2012-08-30 NOTE — Progress Notes (Signed)
Physical Therapy Session Note  Patient Details  Name: [name] MRN: [id number] Date of Birth: [birth year]  Today's Date: 08/30/2012 Time: 1430-1500 Time Calculation (min): 30 min   Therapy Documentation Precautions:  Precautions: Fall Weight Bearing Restrictions: Yes RLE Weight Bearing: Weight bearing as tolerated with post-op shoe on Pain: Pain Assessment Pain Assessment: 0-10 Pain Score: 4 Pain Type: Acute pain Pain Location: Leg Pain Orientation: Right;Lower Pain Descriptors: Cramping Pain Frequency: Intermittent Pain Onset: On-going  Therapeutic Exercise:(30') Nu-step x 15 minutes level 4 and walking with RW for generalized strength/endurance 2 x 200'    See FIM for current functional status  Therapy/Group: Individual Therapy  [name] 08/30/2012, 3:09 PM

## 2012-08-30 NOTE — Progress Notes (Signed)
ANTICOAGULATION CONSULT NOTE - Follow Up Consult  Pharmacy Consult for Coumadin Indication: VTE prophylaxis  No Known Allergies  Patient Measurements: Height: 6\' 2"  (188 cm) IBW/kg (Calculated) : 82.2  Heparin Dosing Weight:   Vital Signs:    Labs:  Basename 08/30/12 0710 08/29/12 0550 08/28/12 0535  HGB -- 11.1* --  HCT -- 35.2* --  PLT -- 183 --  APTT -- -- --  LABPROT 20.0* 18.5* 16.1*  INR 1.77* 1.59* 1.32  HEPARINUNFRC -- -- --  CREATININE -- 0.97 --  CKTOTAL -- -- --  CKMB -- -- --  TROPONINI -- -- --    The CrCl is unknown because both a height and weight (above a minimum accepted value) are required for this calculation.   Medications:  Scheduled:    . antiseptic oral rinse  15 mL Mouth Rinse BID  . mupirocin ointment   Topical BID  . [COMPLETED] warfarin  15 mg Oral ONCE-1800  . warfarin  1 each Does not apply Once  . Warfarin - Pharmacist Dosing Inpatient   Does not apply q1800    Assessment: 36yo male on Coumadin for VTE px.  INR is approaching goal.  No bleeding problems noted.  Goal of Therapy:  INR 2-3 Monitor platelets by anticoagulation protocol: Yes   Plan:  1.  Repeat Coumadin 15mg  today 2.  Continue daily INR  Marisue Humble, PharmD Clinical Pharmacist [facility]

## 2012-08-31 ENCOUNTER — Inpatient Hospital Stay (HOSPITAL_COMMUNITY): Payer: No Typology Code available for payment source

## 2012-08-31 ENCOUNTER — Inpatient Hospital Stay (HOSPITAL_COMMUNITY): Payer: No Typology Code available for payment source | Admitting: Physical Therapy

## 2012-08-31 LAB — PROTIME-INR: Prothrombin Time: 23.9 seconds — ABNORMAL HIGH (ref 11.6–15.2)

## 2012-08-31 MED ORDER — WARFARIN SODIUM 2.5 MG PO TABS
12.5000 mg | ORAL_TABLET | Freq: Once | ORAL | Status: AC
  Administered 2012-08-31: 12.5 mg via ORAL
  Filled 2012-08-31: qty 1

## 2012-08-31 NOTE — Progress Notes (Signed)
ANTICOAGULATION CONSULT NOTE - Follow Up Consult  Pharmacy Consult for Coumadin Indication: VTE prophylaxis  No Known Allergies  Patient Measurements: Height: 6\' 2"  (188 cm) IBW/kg (Calculated) : 82.2  Heparin Dosing Weight:   Vital Signs: Temp: 97.3 F (36.3 C) (12/22 0633) Temp src: Oral (12/22 0633) BP: 121/83 mmHg (12/22 0633) Pulse Rate: 76  (12/22 0633)  Labs:  Basename 08/31/12 0545 08/30/12 0710 08/29/12 0550  HGB -- -- 11.1*  HCT -- -- 35.2*  PLT -- -- 183  APTT -- -- --  LABPROT 23.9* 20.0* 18.5*  INR 2.25* 1.77* 1.59*  HEPARINUNFRC -- -- --  CREATININE -- -- 0.97  CKTOTAL -- -- --  CKMB -- -- --  TROPONINI -- -- --    The CrCl is unknown because both a height and weight (above a minimum accepted value) are required for this calculation.   Medications:  Scheduled:    . antiseptic oral rinse  15 mL Mouth Rinse BID  . mupirocin ointment   Topical BID  . [COMPLETED] warfarin  15 mg Oral NOW  . warfarin  1 each Does not apply Once  . Warfarin - Pharmacist Dosing Inpatient   Does not apply q1800    Assessment: 36yo male on Coumadin for VTE px.  INR with good jump to 2.25 this AM.  No bleeding problems noted.  Goal of Therapy:  INR 2-3 Monitor platelets by anticoagulation protocol: Yes   Plan:  1.  Coumadin 12.5mg  2.  Continue daily INR  Marisue Humble, PharmD Clinical Pharmacist [facility]
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
## 2012-08-31 NOTE — Progress Notes (Signed)
Physical Therapy Note  Patient Details  Name: CARR SHARTZER MRN: [id number] Date of Birth: 03-08-1976 Today's Date: 08/31/2012  1100-1155 (55 minutes) individual Pain: no reported pain Focus of treatment: gait training/endurance including step training; therapeutic exercises for bilateral LE strengthening/activity tolerance Treatment: gait to/from gym 120 feet X 2 RW SBA WBAT Rt LE with cast shoe; Nustep Level 5 x 15 minutes; up/down 2-3 steps one rail (right) sideways min assist + vcs for sequencing .   Dalene Robards,JIM 08/31/2012, 7:39 AM

## 2012-08-31 NOTE — Progress Notes (Signed)
Occupational Therapy Session Note  Patient Details  Name: [name] MRN: [id number] Date of Birth: June 05, 1976  Today's Date: 08/31/2012 Time: 0800-0854 Time Calculation (min): 54 min  Short Term Goals: Week 1:     Skilled Therapeutic Interventions: ADL-retraining at tub with tub bench with emphasis on functional mobility, standard tub transfers, and patient ed on use of AE (reacher and sock aid).   Patient unable to bend forward to don underwear and pants of R-LE but improved after using reacher as instructed.   Patient able to don left sock using sock aid.   Transfer skills are approaching mod independent as patient required no verbal cues to complete transfers during this session.   OT notified RN of an additional small skin tear at buttocks (barrier cream applied).     Therapy Documentation Precautions:  Precautions Precautions: Fall Restrictions Weight Bearing Restrictions: Yes RLE Weight Bearing: Weight bearing as tolerated  Pain: Pain Assessment Pain Assessment: 0-10 Pain Score: 10-Worst pain ever Pain Type: Acute pain Pain Location: Leg Pain Descriptors: Sharp Pain Frequency: Intermittent Pain Onset: On-going Pain Intervention(s): Distraction;RN made aware;Medication (See eMAR)  See FIM for current functional status  Therapy/Group: Individual Therapy  Second session: Time: 12:58 - 13:54 Time Calculation (min): 54 min  Pain Assessment: 7/10  Skilled Therapeutic Interventions: Therapeutic activities with emphasis on general conditioning, dynamic standing balance, weight-shifting, and functional mobility using RW.   Patient completed NuStep X 10 min, level 4, with moderate exertion and 30 min of standing balance activity using Nintendo Wii, with multiple rest breaks during alternating game play.  RW adjusted with tennis ball gliders this date.   See FIM for current functional status  Therapy/Group: Individual Therapy  Georgeanne Nim 08/31/2012, 8:55  AM

## 2012-08-31 NOTE — Progress Notes (Signed)
1000 Pt. Transferred to 4147; Report given to Darel Hong, Charity fundraiser.  All patient belongings in tow.  Pt. Agreeable to transfer.

## 2012-08-31 NOTE — Progress Notes (Signed)
Skin , subcutaneous skin was sharply debrided at surgery. Op note describes sharp debridement. The scalpel was used to debride skin and subQ tissue.

## 2012-09-01 ENCOUNTER — Inpatient Hospital Stay (HOSPITAL_COMMUNITY): Payer: No Typology Code available for payment source | Admitting: Physical Therapy

## 2012-09-01 ENCOUNTER — Inpatient Hospital Stay (HOSPITAL_COMMUNITY): Payer: No Typology Code available for payment source | Admitting: Occupational Therapy

## 2012-09-01 ENCOUNTER — Inpatient Hospital Stay (HOSPITAL_COMMUNITY): Payer: No Typology Code available for payment source | Admitting: *Deleted

## 2012-09-01 DIAGNOSIS — S82109B Unspecified fracture of upper end of unspecified tibia, initial encounter for open fracture type I or II: Secondary | ICD-10-CM

## 2012-09-01 DIAGNOSIS — S069X9A Unspecified intracranial injury with loss of consciousness of unspecified duration, initial encounter: Secondary | ICD-10-CM

## 2012-09-01 MED ORDER — WARFARIN SODIUM 5 MG PO TABS
5.0000 mg | ORAL_TABLET | Freq: Once | ORAL | Status: AC
  Administered 2012-09-01: 5 mg via ORAL
  Filled 2012-09-01: qty 1

## 2012-09-01 NOTE — Progress Notes (Signed)
RLE with soft cast in place, (+) CMS. 2 percocet given at 2112 for complaint of right knee and right ankle pain. Observed up once without assistance. Alfredo Martinez A

## 2012-09-01 NOTE — Progress Notes (Signed)
Physical Therapy Note  Patient Details  Name: [name] MRN: [id number] Date of Birth: Feb 14, 1976 Today's Date: 09/01/2012  Time: 1300-1330 30 minutes  No c/o pain. Supine therex for LE strengthening R LE 2 x 15 with improved activity tolerance noted.  Seated ball toss with weighted medicine ball for UE strengthening and overall activity tolerance.  Tossed 5# ball 3 x 2 minutes.  Overall pt mod I with mobility and transfers and gait with RW.  Individual therapy  DONAWERTH,KAREN 09/01/2012, 1:29 PM
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
## 2012-09-01 NOTE — Progress Notes (Signed)
Occupational Therapy Session Note  Patient Details  Name: [name] MRN: [id number] Date of Birth: [birth year]  Today's Date: 09/01/2012 Time: 1140-1210 Time Calculation (min): 30 min  Short Term Goals: LTG=STG  Skilled Therapeutic Interventions/Progress Updates:  Patient resting in recliner upon arrival stating that his RLE cast was wet.  Patient unsure if it was from sweat or from shower.  Assisted patient to soak up as much as we could, RN notified of wet cast and to the strong odor coming from the cast.  Congratulated the patient for now being Modified Independent in his hospital room using his walker.  Patient engaged in simple HM tasks to include walker safety with straighten up his room, and kitchen task of frying an egg.  Encouraged patient to ambulate to/from therapy apartment however he declined and asked to go by w/c.  Reviewed with patient that he will not have a w/c when he goes home yet he still declined to ambulate short distance.  Once in therapy kitchen, patient completed HM tasks using the walker and working on standing tolerance.  Patient overall supervision to Mod I for simple HM tasks.  Therapy Documentation Precautions:  Precautions Precautions: Fall Restrictions Weight Bearing Restrictions: Yes RLE Weight Bearing: Weight bearing as tolerated Pain: 6/10 in RUE, premedicated per patient.  Adjusted therapy to accommodate for pain.  Therapy/Group: Individual Therapy  Lucita Montoya 09/01/2012, 12:48 PM

## 2012-09-01 NOTE — Progress Notes (Signed)
Occupational Therapy Note  Patient Details  Name: ABE SCHOOLS MRN: [id number] Date of Birth: 10/01/1975 Today's Date: 09/01/2012  Time:  1000-1045  (45 min)  1st session Individual session Pain:  5/10  RLE  See [facility]   Engaged in bathing and dressing at shower level.  Pt. Ambulated with RW to shower at mod I level.  He needed some verbal cues for organizing supplies.   Step out of shower a few steps and stopped him due to floor being wet.  Reminded him of the danger of walking on slick floor especially with cast on one leg.  Pt. Completed rest of activities at mod I level.  Time:  1415-1500  (45 min)  2nd session Individual session Pain:  5/10  RLE  See [facility]    Skilled therapeutic activities for functional mobility, strength, endurance, and walker safety.  Pt. Ambulated down to laundry room.  Was unable to actually wash clothes due to washer being occupied, but demonstrated no problems with the task.  Addressed visual problem solving and anticipatory awareness with game of blink.  Pt. Exhibited decreased problem solving with matching shape, number, color, but able to do with minimal verbal cues.  Ambulated back to room on 4100 with no rest breaks.  Pt. Fatigued at end of session.       Humberto Seals 09/01/2012, 12:55 PM

## 2012-09-01 NOTE — Progress Notes (Signed)
Physical Therapy Note  Patient Details  Name: [name] MRN: [id number] Date of Birth: October 25, 1975 Today's Date: 09/01/2012  1530-1555 (25 minutes) individual Pain: no reported pain Focus of treatment: therapeutic exercises focused on activity tolerance; gait up/down 2-3 steps with rail Treatment: Pt modified independent in room with RW WBAT RT LE; gait to/from room to gym 120 feet RW distant SBA; Nustep Level 5 X 15 minutes; up/down 2-3 steps with one rail modified independent.   Evens Meno,JIM 09/01/2012, 7:47 AM

## 2012-09-01 NOTE — Progress Notes (Signed)
ANTICOAGULATION CONSULT NOTE - Follow Up Consult  Pharmacy Consult for Coumadin Indication: VTE prophylaxis  No Known Allergies  Patient Measurements: Height: 6\' 2"  (188 cm) IBW/kg (Calculated) : 82.2    Vital Signs: Temp: 98.1 F (36.7 C) (12/23 0430) Temp src: Oral (12/23 0430) BP: 127/82 mmHg (12/23 0430) Pulse Rate: 86  (12/23 0430)  Labs:  Alvira Philips 09/01/12 0609 08/31/12 0545 08/30/12 0710  HGB -- -- --  HCT -- -- --  PLT -- -- --  APTT -- -- --  LABPROT 30.3* 23.9* 20.0*  INR 3.10* 2.25* 1.77*  HEPARINUNFRC -- -- --  CREATININE -- -- --  CKTOTAL -- -- --  CKMB -- -- --  TROPONINI -- -- --    The CrCl is unknown because both a height and weight (above a minimum accepted value) are required for this calculation.   Medications:  Scheduled:     . antiseptic oral rinse  15 mL Mouth Rinse BID  . mupirocin ointment   Topical BID  . [COMPLETED] warfarin  12.5 mg Oral ONCE-1800  . warfarin  1 each Does not apply Once  . Warfarin - Pharmacist Dosing Inpatient   Does not apply q1800    Assessment: 36yo male on Coumadin for VTE px.  INR slightly greater than goal this am.  No bleeding problems noted.  Goal of Therapy:  INR 2-3 Monitor platelets by anticoagulation protocol: Yes   Plan:  1.  Coumadin 5 mg 2.  Continue daily INR  Jamez Ambrocio L. Illene Bolus, PharmD, BCPS Clinical Pharmacist Pager: [phone] Pharmacy: [phone] 09/01/2012 12:40 PM
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
## 2012-09-01 NOTE — Discharge Summary (Signed)
  Discharge summary job [id number]

## 2012-09-02 ENCOUNTER — Encounter (HOSPITAL_COMMUNITY): Payer: Self-pay | Admitting: Occupational Therapy

## 2012-09-02 ENCOUNTER — Inpatient Hospital Stay (HOSPITAL_COMMUNITY): Payer: Self-pay | Admitting: Physical Therapy

## 2012-09-02 MED ORDER — METHOCARBAMOL 500 MG PO TABS: 500.0000 mg | ORAL_TABLET | Freq: Four times a day (QID) | ORAL | Status: DC | PRN

## 2012-09-02 MED ORDER — OXYCODONE-ACETAMINOPHEN 5-325 MG PO TABS: 1.0000 | ORAL_TABLET | ORAL | Status: DC | PRN

## 2012-09-02 MED ORDER — WARFARIN SODIUM 5 MG PO TABS: 5.0000 mg | ORAL_TABLET | Freq: Every day | ORAL | Status: DC

## 2012-09-02 MED ORDER — WARFARIN SODIUM 5 MG PO TABS
5.0000 mg | ORAL_TABLET | Freq: Every day | ORAL | Status: DC
  Filled 2012-09-02: qty 1
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
## 2012-09-02 NOTE — Patient Care Conference (Signed)
Inpatient RehabilitationTeam Conference and Plan of Care Update Date: 09/01/2012   Time: M    Patient Name: [name]      Medical Record Number: [id number]  Date of Birth: 1976-02-01 Sex: Male         Room/Bed: 4147/4147-01 Payor Info: Payor: MED PAY  Plan: MED PAY ASSURANCE  Product Type: *No Product type*     Admitting Diagnosis: Tib Fib fx rt   Admit Date/Time:  08/28/2012  2:58 PM Admission Comments: No comment available   Primary Diagnosis:  Tibia fracture Principal Problem: Tibia fracture  Patient Active Problem List   Diagnosis Date Noted  . Tibia fracture 08/28/2012  . Open fracture of shaft of right tibia 08/25/2012    Expected Discharge Date: Expected Discharge Date: 09/02/12  Team Members Present: Physician leading conference: Dr. Claudette Laws Social Worker Present: Amada Jupiter, LCSW Nurse Present: Carlean Purl, RN PT Present: Reggy Eye, PT OT Present: Mackie Pai, OT SLP Present: Feliberto Gottron, SLP     Current Status/Progress Goal Weekly Team Focus  Medical   cognition back to baseline  D/C to home  D/C planning   Bowel/Bladder   continent of bowel/bladder; LBM 12/21  remain continent of bowel/bladder  monitor   Swallow/Nutrition/ Hydration             ADL's   overall mod I  mod I      Mobility   mod I  mod I  d/c planning   Communication             Safety/Cognition/ Behavioral Observations            Pain   Complains pain to Right Leg fracture; 2 percocet tablet Q4hr PRN    3 or less on scale 0-10  monitor, reassess for the effectivenss of pain medication   Skin   Abrasion to forhead-bactroban applied; soft cast to right leg; wound to left shin new dressing changed.  healing abrasion to forehead; no new skin breakdown or infection to current wound  monitor, applied medication and change the dsg as needed    Rehab Goals Patient on target to meet rehab goals: Yes *See Interdisciplinary Assessment and Plan and progress notes  for long and short-term goals  Barriers to Discharge: none    Possible Resolutions to Barriers:  D/C in am     Discharge Planning/Teaching Needs:  Home at modified independent living to boarding house - friends to "check in"      Team Discussion:  Has made excellent progress. Very motivated and ready for d/c tomorrow!  Revisions to Treatment Plan:  No changes - ready for d/c   Continued Need for Acute Rehabilitation Level of Care: The patient requires daily medical management by a physician with specialized training in physical medicine and rehabilitation for the following conditions: Daily direction of a multidisciplinary physical rehabilitation program to ensure safe treatment while eliciting the highest outcome that is of practical value to the patient.: Yes Daily medical management of patient stability for increased activity during participation in an intensive rehabilitation regime.: Yes Daily analysis of laboratory values and/or radiology reports with any subsequent need for medication adjustment of medical intervention for : Post surgical problems  [name] 09/02/2012, 9:13 AM

## 2012-09-02 NOTE — Progress Notes (Signed)
Physical Therapy Note  Patient Details  Name: [name] MRN: [id number] Date of Birth: 11-18-1975 Today's Date: 09/02/2012  Time: 800-827 27 minutes  Pt c/o 7/10 pain in R LE, RN made aware.  Gait household and controlled environments with mod I, obstacle negotiation and side/backwards stepping mod I with RW.  Stair training with 1 handrail to simulate home entry with mod I.  Supine therex to review HEP pt able to perform 2 x 10.  Pt reports he feels safe and ready to d/c home today at mod I level.  Individual therapy   [name] 09/02/2012, 8:28 AM

## 2012-09-02 NOTE — Progress Notes (Signed)
Occupational Therapy Discharge Summary  Patient Details  Name: [name] MRN: [id number] Date of Birth: 11/12/1975  Today's Date: 09/02/2012 Time: 1000-1045 Time Calculation (min): 45 min  Patient has met 10 of 10 long term goals due to improved activity tolerance, improved balance and ability to compensate for deficits.  Patient to discharge at overall Modified Independent level and can guide caregiver if assistance is needed for BADL tasks upon discharge.  Reasons goals not met: n/a secondary to all goals met  Recommendation: No follow up OT recommended at this time.  Equipment: tub bench  Reasons for discharge: treatment goals met and discharge from hospital  Patient/family agrees with progress made and goals achieved: Yes  OT Discharge Precautions/Restrictions  Precautions Precautions: Fall Restrictions Weight Bearing Restrictions: Yes RLE Weight Bearing: Weight bearing as tolerated Pain 6/10 in RLE, premedicated. Vision/Perception  Vision - History Baseline Vision: No visual deficits Patient Visual Report: No change from baseline  Cognition Overall Cognitive Status: Appears within functional limits for tasks assessed Arousal/Alertness: Awake/alert Orientation Level: Oriented X4 Sensation Sensation Light Touch: Appears Intact Proprioception: Appears Intact Coordination Gross Motor Movements are Fluid and Coordinated: Yes Motor  Motor Motor: Within Functional Limits Mobility  Bed Mobility Supine to Sit: 6: Modified independent (Device/Increase time) Sitting - Scoot to Edge of Bed: 6: Modified independent (Device/Increase time) Sit to Supine: 6: Modified independent (Device/Increase time) Transfers Sit to Stand: 6: Modified independent (Device/Increase time) Stand to Sit: 6: Modified independent (Device/Increase time)  Trunk/Postural Assessment  Cervical Assessment Cervical Assessment: Within Functional Limits Thoracic Assessment Thoracic Assessment:  Within Functional Limits Lumbar Assessment Lumbar Assessment: Within Functional Limits Postural Control Postural Control: Within Functional Limits  Balance Balance Balance Assessed: Yes Static Sitting Balance Static Sitting - Level of Assistance: 7: Independent Static Standing Balance Static Standing - Level of Assistance: 6: Modified independent (Device/Increase time) Dynamic Standing Balance Dynamic Standing - Level of Assistance: 6: Modified independent (Device/Increase time) Extremity/Trunk Assessment RUE Assessment RUE Assessment: Within Functional Limits LUE Assessment LUE Assessment: Within Functional Limits  See FIM for current functional status  Dontell Mian 09/02/2012, 11:31 AM

## 2012-09-02 NOTE — Progress Notes (Signed)
Social Work  Discharge Note  The overall goal for the admission was met for:   Discharge location: Yes - return to boarding house with intermittent assistance of friends  Length of Stay: Yes - 5 days  Discharge activity level: Yes - modified independent  Home/community participation: Yes  Services provided included: MD, RD, PT, OT, SLP, RN, TR, Pharmacy and SW  Financial Services: Other: Medicaid application pending  Follow-up services arranged: Home Health: RN, PT via [facility], DME: bariatric rolling walker and tub bench via [facility] and Patient/Family has no preference for HH/DME agencies  Comments (or additional information): Provided pt's friend [employer]) with information on community resources which may be of assistance to him and patient:  Financial planner Aid  Patient/Family verbalized understanding of follow-up arrangements: Yes  Individual responsible for coordination of the follow-up plan: patient (with assistance as needed from friend, [employer])  Confirmed correct DME delivered: Amada Jupiter 09/02/2012    Eleshia Wooley

## 2012-09-02 NOTE — Progress Notes (Signed)
Patient discharge to home with a friend at 1125.  Discharge instruction provide by Deatra Ina, PA.  Patient verbalize understanding with no further questions.  Patient received AM medications, had shower with therapy, dressing changed.

## 2012-09-02 NOTE — Progress Notes (Signed)
Physical Therapy Discharge Summary  Patient Details  Name: [name] MRN: [id number] Date of Birth: [birth year]  Today's Date: 09/02/2012  Patient has met 7 of 7 long term goals due to improved activity tolerance, improved balance, increased strength and decreased pain.  Patient to discharge at an ambulatory level Modified Independent.    Reasons goals not met: n/a  Recommendation:  Patient will benefit from ongoing skilled PT services in home health setting to continue to advance safe functional mobility, address ongoing impairments in strength, balance, gait, and minimize fall risk.  Equipment: RW  Reasons for discharge: treatment goals met and discharge from hospital  Patient/family agrees with progress made and goals achieved: Yes  PT Discharge  Cognition Overall Cognitive Status: Appears within functional limits for tasks assessed Sensation Sensation Light Touch: Appears Intact Proprioception: Appears Intact Coordination Gross Motor Movements are Fluid and Coordinated: Yes Motor  Motor Motor: Within Functional Limits   Trunk/Postural Assessment  Cervical Assessment Cervical Assessment: Within Functional Limits Thoracic Assessment Thoracic Assessment: Within Functional Limits Lumbar Assessment Lumbar Assessment: Within Functional Limits Postural Control Postural Control: Within Functional Limits  Balance Balance Balance Assessed: Yes Static Sitting Balance Static Sitting - Level of Assistance: 7: Independent Static Standing Balance Static Standing - Level of Assistance: 6: Modified independent (Device/Increase time) Dynamic Standing Balance Dynamic Standing - Level of Assistance: 6: Modified independent (Device/Increase time) Extremity Assessment      RLE Assessment RLE Assessment: Within Functional Limits LLE Assessment LLE Assessment: Within Functional Limits  See FIM for current functional status  [name] 09/02/2012, 8:25 AM
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
## 2015-12-31 ENCOUNTER — Encounter (HOSPITAL_COMMUNITY): Payer: Self-pay | Admitting: Emergency Medicine

## 2015-12-31 ENCOUNTER — Emergency Department (HOSPITAL_COMMUNITY)
Admission: EM | Admit: 2015-12-31 | Discharge: 2015-12-31 | Disposition: A | Discharge status: Home / Self Care | Payer: No Typology Code available for payment source | Attending: Emergency Medicine | Admitting: Emergency Medicine

## 2015-12-31 DIAGNOSIS — H9209 Otalgia, unspecified ear: Secondary | ICD-10-CM | POA: Insufficient documentation

## 2015-12-31 DIAGNOSIS — H6123 Impacted cerumen, bilateral: Secondary | ICD-10-CM | POA: Insufficient documentation

## 2015-12-31 DIAGNOSIS — Z7901 Long term (current) use of anticoagulants: Secondary | ICD-10-CM | POA: Insufficient documentation

## 2015-12-31 DIAGNOSIS — R220 Localized swelling, mass and lump, head: Secondary | ICD-10-CM | POA: Insufficient documentation

## 2015-12-31 DIAGNOSIS — K002 Abnormalities of size and form of teeth: Secondary | ICD-10-CM | POA: Insufficient documentation

## 2015-12-31 DIAGNOSIS — K029 Dental caries, unspecified: Secondary | ICD-10-CM | POA: Insufficient documentation

## 2015-12-31 MED ORDER — OXYCODONE-ACETAMINOPHEN 5-325 MG PO TABS
1.0000 | ORAL_TABLET | Freq: Four times a day (QID) | ORAL | Status: DC | PRN
Start: 2015-12-31 — End: 2023-04-27

## 2015-12-31 MED ORDER — PENICILLIN V POTASSIUM 500 MG PO TABS: 500.0000 mg | ORAL_TABLET | Freq: Three times a day (TID) | ORAL | Status: DC

## 2015-12-31 NOTE — ED Provider Notes (Signed)
CSN: [id number]     Arrival date & time 12/31/15  1522 History  By signing my name below, I, Soijett Blue, attest that this documentation has been prepared under the direction and in the presence of Fayrene Helper, PA-C Electronically Signed: Soijett Blue, ED Scribe. 12/31/2015. 4:51 PM.   Chief Complaint  Patient presents with  . Facial Swelling      The history is provided by the patient. No language interpreter was used.    [name] is a 40 y.o. male who presents to the Emergency Department complaining of facial swelling to left jaw onset yesterday. Pt states that he had a broken tooth that he pulled pieces of his tooth out prior to the onset of his symptoms. Pt reports that he has been seen for his symptoms at the dentist and informed that he has three left upper impacted teeth 2 weeks ago. Pt was Rx an antibiotic that he has completed the course of. Pt has a dentist appointment in 4 days and he is here today for pain control. He states that he is having associated symptoms of ear pain. He states that has tried ibuprofen and orajel with no relief for his symptoms. He denies fever, pain with eye movement, trouble swallowing, and any other symptoms. Pt denies allergies to any medications. Pt is UTD on his immunizations and he denies recent travel.     Past Medical History  Diagnosis Date  . No pertinent past medical history    Past Surgical History  Procedure Laterality Date  . No past surgeries    . Tibia im nail insertion  08/25/2012    Procedure: INTRAMEDULLARY (IM) NAIL TIBIAL;  Surgeon: Eldred Manges, MD;  Location: [facility] OR;  Service: Orthopedics;  Laterality: Right;   History reviewed. No pertinent family history. Social History  Substance Use Topics  . Smoking status: Never Smoker   . Smokeless tobacco: None  . Alcohol Use: No    Review of Systems  Constitutional: Negative for fever.  HENT: Positive for dental problem and ear pain. Negative for trouble swallowing.        Allergies  Review of patient's allergies indicates no known allergies.  Home Medications   Prior to Admission medications   Medication Sig Start Date End Date Taking? Authorizing Provider  methocarbamol (ROBAXIN) 500 MG tablet Take 1 tablet (500 mg total) by mouth every 6 (six) hours as needed. 09/02/12   Mcarthur Rossetti Angiulli, PA-C  oxyCODONE-acetaminophen (PERCOCET/ROXICET) 5-325 MG per tablet Take 1-2 tablets by mouth every 4 (four) hours as needed. 09/02/12   Mcarthur Rossetti Angiulli, PA-C  warfarin (COUMADIN) 5 MG tablet Take 1 tablet (5 mg total) by mouth daily at 6 PM. 09/02/12   Mcarthur Rossetti Angiulli, PA-C   BP 135/87 mmHg  Pulse 76  Temp(Src) 98.8 F (37.1 C) (Oral)  Resp 16  SpO2 98% Physical Exam  Constitutional: He is oriented to person, place, and time. He appears well-developed and well-nourished. No distress.  HENT:  Head: Normocephalic and atraumatic.  Mouth/Throat: Uvula is midline. No trismus in the jaw. Abnormal dentition. Dental caries present. No uvula swelling.  Bilateral Cerumen impaction obstructing the TMs. Moderate swelling noted to the left cheek with TTP. No evidence of orbital cellulitis.  Dental decay and old dental fx noted to tooth number 19. Tenderness noted to left upper gumline without gingival erythema. Uvula midline. No tonsillar enlargement or exudate.  No cervical LAD.  Eyes: EOM are normal.  Neck: Neck supple.  Cardiovascular: Normal rate.   Pulmonary/Chest: Effort normal. No respiratory distress.  Abdominal: He exhibits no distension.  Musculoskeletal: Normal range of motion.  Neurological: He is alert and oriented to person, place, and time.  Skin: Skin is warm and dry.  Psychiatric: He has a normal mood and affect. His behavior is normal.  Nursing note and vitals reviewed.   ED Course  Procedures (including critical care time) DIAGNOSTIC STUDIES: Oxygen Saturation is 98% on RA, nl by my interpretation.    COORDINATION OF CARE: 4:51 PM  Discussed treatment plan with pt at bedside which includes pain medication and pt agreed to plan.    MDM   Final diagnoses:  Left facial swelling    Patient with dentalgia who has been seen by a dentist for his dental pain 2 weeks ago and informed that he had impacted teeth requiring dental extraction. Pt has an appointment 3 days. Pt is here today due to worsening pain and persistent facial swelling associated with presented symptoms. Will provide abx Rx and short course of pain medications. Pt will follow up with dental appointment in 3 days. I have considered mumps as possible dx, but pt states that he is UTD with immunizations.  Discussed return precautions. Pt safe for discharge.  BP 135/87 mmHg  Pulse 76  Temp(Src) 98.8 F (37.1 C) (Oral)  Resp 16  SpO2 98%   I personally performed the services described in this documentation, which was scribed in my presence. The recorded information has been reviewed and is accurate.      Fayrene HelperBowie Adarius Tigges, PA-C 12/31/15 1657  Rolland PorterMark James, MD 01/13/16 [phone]

## 2015-12-31 NOTE — ED Notes (Signed)
Pt has L lateral jaw swelling since yesterday. Pt knows he has 3 impacted teeth that need to be removed. Will see dentist on Tuesday.

## 2015-12-31 NOTE — Discharge Instructions (Signed)
Please follow up closely with your dentist for further management of your dental pain and facial swelling.

## 2020-08-16 ENCOUNTER — Ambulatory Visit: Payer: Self-pay | Attending: Internal Medicine

## 2020-08-16 DIAGNOSIS — Z23 Encounter for immunization: Secondary | ICD-10-CM

## 2020-08-16 NOTE — Progress Notes (Signed)
   Covid-19 Vaccination Clinic  Name:  [name]    MRN: [id number] DOB: 02-Jun-1976  08/16/2020  [name] was observed post Covid-19 immunization for 15 minutes without incident. He was provided with Vaccine Information Sheet and instruction to access the V-Safe system.   [name] was instructed to call 911 with any severe reactions post vaccine: Marland Kitchen Difficulty breathing  . Swelling of face and throat  . A fast heartbeat  . A bad rash all over body  . Dizziness and weakness   Immunizations Administered    Name Date Dose VIS Date Route   JANSSEN COVID-19 VACCINE 08/16/2020  2:32 PM 0.5 mL 06/29/2020 Intramuscular   Manufacturer: [employer]   Lot: [id number]   NDC: [phone]

## 2023-04-25 ENCOUNTER — Emergency Department (HOSPITAL_COMMUNITY): Payer: BLUE CROSS/BLUE SHIELD

## 2023-04-25 ENCOUNTER — Observation Stay (HOSPITAL_COMMUNITY)
Admission: EM | Admit: 2023-04-25 | Discharge: 2023-04-27 | Disposition: A | Discharge status: Home / Self Care | Payer: BLUE CROSS/BLUE SHIELD | Attending: Surgery | Admitting: Surgery

## 2023-04-25 DIAGNOSIS — R739 Hyperglycemia, unspecified: Secondary | ICD-10-CM | POA: Diagnosis present

## 2023-04-25 DIAGNOSIS — K8000 Calculus of gallbladder with acute cholecystitis without obstruction: Secondary | ICD-10-CM | POA: Diagnosis present

## 2023-04-25 DIAGNOSIS — R1084 Generalized abdominal pain: Principal | ICD-10-CM

## 2023-04-25 DIAGNOSIS — K8012 Calculus of gallbladder with acute and chronic cholecystitis without obstruction: Secondary | ICD-10-CM | POA: Diagnosis not present

## 2023-04-25 DIAGNOSIS — Z6841 Body Mass Index (BMI) 40.0 and over, adult: Secondary | ICD-10-CM | POA: Insufficient documentation

## 2023-04-25 HISTORY — DX: Other specified health status: Z78.9

## 2023-04-25 LAB — CBC WITH DIFFERENTIAL/PLATELET
Abs Immature Granulocytes: 0.04 10*3/uL (ref 0.00–0.07)
Basophils Absolute: 0 10*3/uL (ref 0.0–0.1)
Basophils Relative: 0 %
Eosinophils Absolute: 0 10*3/uL (ref 0.0–0.5)
Eosinophils Relative: 0 %
HCT: 45.5 % (ref 39.0–52.0)
Hemoglobin: 14.2 g/dL (ref 13.0–17.0)
Immature Granulocytes: 1 %
Lymphocytes Relative: 7 %
Lymphs Abs: 0.5 10*3/uL — ABNORMAL LOW (ref 0.7–4.0)
MCH: 27.2 pg (ref 26.0–34.0)
MCHC: 31.2 g/dL (ref 30.0–36.0)
MCV: 87 fL (ref 80.0–100.0)
Monocytes Absolute: 0.3 10*3/uL (ref 0.1–1.0)
Monocytes Relative: 4 %
Neutro Abs: 6.8 10*3/uL (ref 1.7–7.7)
Neutrophils Relative %: 88 %
Platelets: 212 10*3/uL (ref 150–400)
RBC: 5.23 MIL/uL (ref 4.22–5.81)
RDW: 13.2 % (ref 11.5–15.5)
WBC: 7.7 10*3/uL (ref 4.0–10.5)
nRBC: 0 % (ref 0.0–0.2)

## 2023-04-25 LAB — COMPREHENSIVE METABOLIC PANEL
ALT: 26 U/L (ref 0–44)
AST: 24 U/L (ref 15–41)
Albumin: 3.9 g/dL (ref 3.5–5.0)
Alkaline Phosphatase: 99 U/L (ref 38–126)
Anion gap: 10 (ref 5–15)
BUN: 15 mg/dL (ref 6–20)
CO2: 27 mmol/L (ref 22–32)
Calcium: 8.9 mg/dL (ref 8.9–10.3)
Chloride: 99 mmol/L (ref 98–111)
Creatinine, Ser: 1.12 mg/dL (ref 0.61–1.24)
GFR, Estimated: >60 mL/min (ref >60)
Glucose, Bld: 126 mg/dL — ABNORMAL HIGH (ref 70–99)
Potassium: 3.7 mmol/L (ref 3.5–5.1)
Sodium: 136 mmol/L (ref 135–145)
Total Bilirubin: 0.5 mg/dL (ref 0.3–1.2)
Total Protein: 7.7 g/dL (ref 6.5–8.1)

## 2023-04-25 LAB — URINALYSIS, ROUTINE W REFLEX MICROSCOPIC
Bacteria, UA: NONE SEEN
Bilirubin Urine: NEGATIVE
Glucose, UA: NEGATIVE mg/dL
Hgb urine dipstick: NEGATIVE
Ketones, ur: NEGATIVE mg/dL
Leukocytes,Ua: NEGATIVE
Nitrite: NEGATIVE
Protein, ur: NEGATIVE mg/dL
Specific Gravity, Urine: 1.03 (ref 1.005–1.030)
pH: 7 (ref 5.0–8.0)

## 2023-04-25 LAB — PROTIME-INR
INR: 1.1 (ref 0.8–1.2)
Prothrombin Time: 14.2 seconds (ref 11.4–15.2)

## 2023-04-25 LAB — LIPASE, BLOOD: Lipase: 41 U/L (ref 11–51)

## 2023-04-25 MED ORDER — CHLORHEXIDINE GLUCONATE CLOTH 2 % EX PADS: 6.0000 | MEDICATED_PAD | Freq: Once | CUTANEOUS | Status: DC

## 2023-04-25 MED ORDER — METOPROLOL TARTRATE 5 MG/5ML IV SOLN
5.0000 mg | Freq: Four times a day (QID) | INTRAVENOUS | Status: DC | PRN
  Filled 2023-04-25: qty 5

## 2023-04-25 MED ORDER — SODIUM CHLORIDE 0.9 % IV SOLN
2.0000 g | Freq: Every day | INTRAVENOUS | Status: DC
  Administered 2023-04-26 (×3): 2 g via INTRAVENOUS
  Filled 2023-04-25 (×3): qty 20

## 2023-04-25 MED ORDER — ACETAMINOPHEN 650 MG RE SUPP: 650.0000 mg | Freq: Four times a day (QID) | RECTAL | Status: DC | PRN

## 2023-04-25 MED ORDER — PROCHLORPERAZINE MALEATE 10 MG PO TABS: 10.0000 mg | ORAL_TABLET | Freq: Four times a day (QID) | ORAL | Status: DC | PRN

## 2023-04-25 MED ORDER — ACETAMINOPHEN 500 MG PO TABS
1000.0000 mg | ORAL_TABLET | ORAL | Status: AC
  Administered 2023-04-26: 1000 mg via ORAL
  Filled 2023-04-25: qty 2

## 2023-04-25 MED ORDER — DIPHENHYDRAMINE HCL 50 MG/ML IJ SOLN: 12.5000 mg | Freq: Four times a day (QID) | INTRAMUSCULAR | Status: DC | PRN

## 2023-04-25 MED ORDER — CELECOXIB 200 MG PO CAPS
200.0000 mg | ORAL_CAPSULE | ORAL | Status: AC
  Administered 2023-04-26: 200 mg via ORAL
  Filled 2023-04-25 (×2): qty 1

## 2023-04-25 MED ORDER — GABAPENTIN 300 MG PO CAPS
300.0000 mg | ORAL_CAPSULE | ORAL | Status: AC
  Administered 2023-04-26: 300 mg via ORAL
  Filled 2023-04-25: qty 1

## 2023-04-25 MED ORDER — CALCIUM POLYCARBOPHIL 625 MG PO TABS
625.0000 mg | ORAL_TABLET | Freq: Two times a day (BID) | ORAL | Status: DC
  Administered 2023-04-26: 625 mg via ORAL
  Filled 2023-04-25 (×2): qty 1

## 2023-04-25 MED ORDER — LACTATED RINGERS IV SOLN: INTRAVENOUS | Status: DC

## 2023-04-25 MED ORDER — MAGIC MOUTHWASH: 15.0000 mL | Freq: Four times a day (QID) | ORAL | Status: DC | PRN

## 2023-04-25 MED ORDER — ONDANSETRON 4 MG PO TBDP: 4.0000 mg | ORAL_TABLET | Freq: Four times a day (QID) | ORAL | Status: DC | PRN

## 2023-04-25 MED ORDER — PIPERACILLIN-TAZOBACTAM 3.375 G IVPB 30 MIN
3.3750 g | Freq: Once | INTRAVENOUS | Status: AC
  Administered 2023-04-25: 3.375 g via INTRAVENOUS
  Filled 2023-04-25: qty 50

## 2023-04-25 MED ORDER — HEPARIN SODIUM (PORCINE) 5000 UNIT/ML IJ SOLN
5000.0000 [IU] | Freq: Three times a day (TID) | INTRAMUSCULAR | Status: DC
  Administered 2023-04-25 – 2023-04-26 (×2): 5000 [IU] via SUBCUTANEOUS
  Filled 2023-04-25 (×2): qty 1

## 2023-04-25 MED ORDER — BUPIVACAINE LIPOSOME 1.3 % IJ SUSP: 20.0000 mL | INTRAMUSCULAR | Status: AC

## 2023-04-25 MED ORDER — MENTHOL 3 MG MT LOZG: 1.0000 | LOZENGE | OROMUCOSAL | Status: DC | PRN

## 2023-04-25 MED ORDER — ONDANSETRON HCL 4 MG/2ML IJ SOLN
4.0000 mg | Freq: Once | INTRAMUSCULAR | Status: AC
  Administered 2023-04-25: 4 mg via INTRAVENOUS
  Filled 2023-04-25: qty 2

## 2023-04-25 MED ORDER — MORPHINE SULFATE (PF) 4 MG/ML IV SOLN
4.0000 mg | Freq: Once | INTRAVENOUS | Status: AC
  Administered 2023-04-25: 4 mg via INTRAVENOUS
  Filled 2023-04-25: qty 1

## 2023-04-25 MED ORDER — IOHEXOL 300 MG/ML  SOLN
100.0000 mL | Freq: Once | INTRAMUSCULAR | Status: AC | PRN
  Administered 2023-04-25: 100 mL via INTRAVENOUS

## 2023-04-25 MED ORDER — NAPHAZOLINE-GLYCERIN 0.012-0.25 % OP SOLN: 1.0000 [drp] | Freq: Four times a day (QID) | OPHTHALMIC | Status: DC | PRN

## 2023-04-25 MED ORDER — CHLORHEXIDINE GLUCONATE CLOTH 2 % EX PADS
6.0000 | MEDICATED_PAD | Freq: Once | CUTANEOUS | Status: AC
  Administered 2023-04-25: 6 via TOPICAL

## 2023-04-25 MED ORDER — SODIUM CHLORIDE 0.9 % IV SOLN: Freq: Three times a day (TID) | INTRAVENOUS | Status: DC | PRN

## 2023-04-25 MED ORDER — SODIUM CHLORIDE 0.9 % IV BOLUS
1000.0000 mL | Freq: Once | INTRAVENOUS | Status: AC
  Administered 2023-04-25: 1000 mL via INTRAVENOUS

## 2023-04-25 MED ORDER — BISACODYL 10 MG RE SUPP: 10.0000 mg | Freq: Two times a day (BID) | RECTAL | Status: DC | PRN

## 2023-04-25 MED ORDER — HYDROMORPHONE HCL 1 MG/ML IJ SOLN
0.5000 mg | INTRAMUSCULAR | Status: DC | PRN
  Administered 2023-04-26: 1 mg via INTRAVENOUS
  Filled 2023-04-25: qty 1

## 2023-04-25 MED ORDER — SIMETHICONE 40 MG/0.6ML PO SUSP: 80.0000 mg | Freq: Four times a day (QID) | ORAL | Status: DC | PRN

## 2023-04-25 MED ORDER — LACTATED RINGERS IV BOLUS
1000.0000 mL | Freq: Once | INTRAVENOUS | Status: AC
  Administered 2023-04-25: 1000 mL via INTRAVENOUS

## 2023-04-25 MED ORDER — LACTATED RINGERS IV BOLUS: 1000.0000 mL | Freq: Three times a day (TID) | INTRAVENOUS | Status: DC | PRN

## 2023-04-25 MED ORDER — ACETAMINOPHEN 325 MG PO TABS: 650.0000 mg | ORAL_TABLET | Freq: Four times a day (QID) | ORAL | Status: DC | PRN

## 2023-04-25 MED ORDER — DIPHENHYDRAMINE HCL 12.5 MG/5ML PO ELIX: 12.5000 mg | ORAL_SOLUTION | Freq: Four times a day (QID) | ORAL | Status: DC | PRN

## 2023-04-25 MED ORDER — ALUM & MAG HYDROXIDE-SIMETH 200-200-20 MG/5ML PO SUSP: 30.0000 mL | Freq: Four times a day (QID) | ORAL | Status: DC | PRN

## 2023-04-25 MED ORDER — SALINE SPRAY 0.65 % NA SOLN: 1.0000 | Freq: Four times a day (QID) | NASAL | Status: DC | PRN

## 2023-04-25 MED ORDER — PHENOL 1.4 % MT LIQD: 2.0000 | OROMUCOSAL | Status: DC | PRN

## 2023-04-25 MED ORDER — PROCHLORPERAZINE EDISYLATE 10 MG/2ML IJ SOLN: 5.0000 mg | Freq: Four times a day (QID) | INTRAMUSCULAR | Status: DC | PRN

## 2023-04-25 MED ORDER — ENSURE PRE-SURGERY PO LIQD
296.0000 mL | Freq: Once | ORAL | Status: AC
  Administered 2023-04-26: 296 mL via ORAL
  Filled 2023-04-25: qty 296

## 2023-04-25 MED ORDER — ONDANSETRON HCL 4 MG/2ML IJ SOLN
4.0000 mg | Freq: Four times a day (QID) | INTRAMUSCULAR | Status: DC | PRN
  Administered 2023-04-26: 4 mg via INTRAVENOUS

## 2023-04-25 NOTE — ED Notes (Signed)
Me and nurse have made several attempts to get a urine sample from patient but patient has not been able to provide a sample.

## 2023-04-25 NOTE — ED Provider Notes (Signed)
  Physical Exam  BP (!) 156/93 (BP Location: Right Arm)   Pulse (!) 59   Temp 98.1 F (36.7 C) (Oral)   Resp (!) 22   SpO2 99%   Physical Exam  Procedures  Procedures  ED Course / MDM     Received care of patient from Dr. Shirlee Limerick.  Please see her note for prior history, physical and care.  With is a 47 year old male who presents with concern for abdominal pain, nausea and vomiting after having steak at the [employer] last night.   Labs completed and personally about interpreted by me show no leukocytosis, no anemia, no transaminitis, normal lipase.  CT abdomen pelvis ordered and pending.  CT completed and shows 3.3cm gallstone in the gallbladder with borderline GB wall thickening.   Has continued pain on my evaluation including positive Murphy and RUQ abdominal pain. Ordered zosyn. Discussed with Dr. Michaell Cowing of general surgery who came to bedside for evaluation.      Alvira Monday, MD 04/26/23 2140

## 2023-04-25 NOTE — ED Triage Notes (Signed)
Pt c/o abd pain with N/V since last night after eating a steak at [employer].

## 2023-04-25 NOTE — Progress Notes (Signed)
A consult was received from an ED physician for Zosyn per pharmacy dosing.  The patient's profile has been reviewed for ht/wt/allergies/indication/available labs.    A one time order has been placed for Zosyn 3.375g IV x1 over 30 minutes.  Further antibiotics/pharmacy consults should be ordered by admitting physician if indicated.                       Thank you,  Lynann Beaver PharmD, BCPS [facility] main pharmacy [phone] 04/25/2023 7:34 PM

## 2023-04-25 NOTE — ED Notes (Addendum)
ED TO INPATIENT HANDOFF REPORT  ED Nurse Name and Phone #: Levander Campion Name/Age/Gender [name] 47 y.o. male Room/Bed: WA04/WA04  Code Status   Code Status: Full Code  Home/SNF/Other Home Patient oriented to: self, place, time, and situation Is this baseline? Yes   Triage Complete: Triage complete  Chief Complaint Acute calculous cholecystitis [K80.00]  Triage Note Pt c/o abd pain with N/V since last night after eating a steak at [facility].   Allergies No Known Allergies  Level of Care/Admitting Diagnosis ED Disposition     ED Disposition  Admit   Condition  --   Comment  Hospital Area: [facility] [[id number]]  Level of Care: Med-Surg [16]  May admit patient to [facility] or [facility] if equivalent level of care is available:: No  Covid Evaluation: Asymptomatic - no recent exposure (last 10 days) testing not required  Diagnosis: Acute calculous cholecystitis [324401]  Admitting Physician: CCS, MD [[id number]]  Attending Physician: CCS, MD [[id number]]  Bed request comments: 3E  Certification:: I certify this patient is being admitted for an inpatient-only procedure  Expected Medical Readiness: 04/27/2023          B Medical/Surgery History Past Medical History:  Diagnosis Date   No pertinent past medical history    Past Surgical History:  Procedure Laterality Date   NO PAST SURGERIES     TIBIA IM NAIL INSERTION  08/25/2012   Procedure: INTRAMEDULLARY (IM) NAIL TIBIAL;  Surgeon: Eldred Manges, MD;  Location: [facility] OR;  Service: Orthopedics;  Laterality: Right;     A IV Location/Drains/Wounds Patient Lines/Drains/Airways Status     Active Line/Drains/Airways     Name Placement date Placement time Site Days   Peripheral IV 04/25/23 20 G 1" Anterior;Distal;Right;Upper Arm 04/25/23  1220  Arm  less than 1            Intake/Output Last 24 hours  Intake/Output Summary (Last 24 hours) at 04/25/2023 2003 Last data filed at 04/25/2023  1349 Gross per 24 hour  Intake 1000 ml  Output --  Net 1000 ml    Labs/Imaging Results for orders placed or performed during the hospital encounter of 04/25/23 (from the past 48 hour(s))  CBC with Differential     Status: Abnormal   Collection Time: 04/25/23 12:25 PM  Result Value Ref Range   WBC 7.7 4.0 - 10.5 K/uL   RBC 5.23 4.22 - 5.81 MIL/uL   Hemoglobin 14.2 13.0 - 17.0 g/dL   HCT 02.7 25.3 - 66.4 %   MCV 87.0 80.0 - 100.0 fL   MCH 27.2 26.0 - 34.0 pg   MCHC 31.2 30.0 - 36.0 g/dL   RDW 40.3 47.4 - 25.9 %   Platelets 212 150 - 400 K/uL   nRBC 0.0 0.0 - 0.2 %   Neutrophils Relative % 88 %   Neutro Abs 6.8 1.7 - 7.7 K/uL   Lymphocytes Relative 7 %   Lymphs Abs 0.5 (L) 0.7 - 4.0 K/uL   Monocytes Relative 4 %   Monocytes Absolute 0.3 0.1 - 1.0 K/uL   Eosinophils Relative 0 %   Eosinophils Absolute 0.0 0.0 - 0.5 K/uL   Basophils Relative 0 %   Basophils Absolute 0.0 0.0 - 0.1 K/uL   Immature Granulocytes 1 %   Abs Immature Granulocytes 0.04 0.00 - 0.07 K/uL    Comment: Performed at [facility], [address] [place], [place] [address]  Comprehensive metabolic panel  Status: Abnormal   Collection Time: 04/25/23 12:25 PM  Result Value Ref Range   Sodium 136 135 - 145 mmol/L   Potassium 3.7 3.5 - 5.1 mmol/L   Chloride 99 98 - 111 mmol/L   CO2 27 22 - 32 mmol/L   Glucose, Bld 126 (H) 70 - 99 mg/dL    Comment: Glucose reference range applies only to samples taken after fasting for at least 8 hours.   BUN 15 6 - 20 mg/dL   Creatinine, Ser 0.98 0.61 - 1.24 mg/dL   Calcium 8.9 8.9 - 11.9 mg/dL   Total Protein 7.7 6.5 - 8.1 g/dL   Albumin 3.9 3.5 - 5.0 g/dL   AST 24 15 - 41 U/L   ALT 26 0 - 44 U/L   Alkaline Phosphatase 99 38 - 126 U/L   Total Bilirubin 0.5 0.3 - 1.2 mg/dL   GFR, Estimated >14 >78 mL/min    Comment: (NOTE) Calculated using the CKD-EPI Creatinine Equation (2021)    Anion gap 10 5 - 15    Comment: Performed at [facility], [address], [place], [place] [address]  Lipase, blood     Status: None   Collection Time: 04/25/23 12:25 PM  Result Value Ref Range   Lipase 41 11 - 51 U/L    Comment: Performed at [facility], [address], [place], [place] [address]  Urinalysis, Routine w reflex microscopic -Urine, Clean Catch     Status: None   Collection Time: 04/25/23  5:50 PM  Result Value Ref Range   Color, Urine YELLOW YELLOW   APPearance CLEAR CLEAR   Specific Gravity, Urine 1.030 1.005 - 1.030   pH 7.0 5.0 - 8.0   Glucose, UA NEGATIVE NEGATIVE mg/dL   Hgb urine dipstick NEGATIVE NEGATIVE   Bilirubin Urine NEGATIVE NEGATIVE   Ketones, ur NEGATIVE NEGATIVE mg/dL   Protein, ur NEGATIVE NEGATIVE mg/dL   Nitrite NEGATIVE NEGATIVE   Leukocytes,Ua NEGATIVE NEGATIVE   RBC / HPF 0-5 0 - 5 RBC/hpf   WBC, UA 0-5 0 - 5 WBC/hpf   Bacteria, UA NONE SEEN NONE SEEN   Squamous Epithelial / HPF 0-5 0 - 5 /HPF   Mucus PRESENT     Comment: Performed at [facility], [address] [place], [place] [address]   CT ABDOMEN PELVIS W CONTRAST  Result Date: 04/25/2023 CLINICAL DATA:  Abdominal pain with nausea and vomiting starting last night. EXAM: CT ABDOMEN AND PELVIS WITH CONTRAST TECHNIQUE: Multidetector CT imaging of the abdomen and pelvis was performed using the standard protocol following bolus administration of intravenous contrast. RADIATION DOSE REDUCTION: This exam was performed according to the departmental dose-optimization program which includes automated exposure control, adjustment of the mA and/or kV according to patient size and/or use of iterative reconstruction technique. CONTRAST:  OMNIPAQUE IOHEXOL 300 MG/ML  SOLN COMPARISON:  08/25/2012 FINDINGS: Lower chest: Upper normal heart size. 5 mm right middle lobe pulmonary nodule on image 11 series 4, previously measuring at mm on 08/25/2012, essentially stable. Likewise stable is a 4 mm posterior  basal segment left lower lobe nodule on image 24 series 4. No further imaging workup of these lesions is indicated. Hepatobiliary: Suspected hepatic steatosis. 3.3 cm gallstone in the gallbladder. Hazy stranding around the gallbladder borderline gallbladder wall thickening, cholecystitis is not excluded. A likely reactive porta hepatis lymph node adjacent to the gallbladder measures 0.1 cm in short axis on image 69 series 5, mildly  prominent. Otherwise unremarkable. Pancreas: Unremarkable Spleen: Unremarkable Adrenals/Urinary Tract: 8 mm fluid density lesion of the left mid upper kidney compatible with Bosniak category 1 simple cyst. No further imaging workup of this lesion is indicated. Adrenal glands in urinary bladder unremarkable. Stomach/Bowel: Unremarkable Vascular/Lymphatic: 0.2 cm right external iliac lymph node on image 91 series 2, previously 0.6 cm. Right inguinal lymph node 1.4 cm in short axis on image 99 series 2, upper normal size, previously 0.9 cm. Additional upper normal bilateral inguinal lymph nodes are present. Reproductive: Unremarkable Other: No supplemental non-categorized findings. Musculoskeletal: Unremarkable IMPRESSION: 1. 3.3 cm gallstone in the gallbladder with borderline gallbladder wall thickening and hazy stranding around the gallbladder, cholecystitis is not excluded. Correlate with symptoms in determining whether further workup such as ultrasound or nuclear medicine hepatobiliary scan may be helpful. 2. Suspected hepatic steatosis. 3. Upper normal heart size. 4. Mildly prominent right external iliac node with upper normal sized inguinal lymph nodes. This appearance is nonspecific although these may be reactive. Electronically Signed   By: Gaylyn Rong M.D.   On: 04/25/2023 17:07    Pending Labs Unresulted Labs (From admission, onward)     Start     Ordered   04/26/23 0500  Hemoglobin A1c  Tomorrow morning,   R        04/25/23 1949   04/26/23 0500  Hepatic function  panel  Tomorrow morning,   R        04/25/23 1949   04/26/23 0500  Potassium  Tomorrow morning,   R        04/25/23 1949   04/26/23 0500  CBC  Tomorrow morning,   R        04/25/23 1949   04/26/23 0500  Creatinine, serum  Tomorrow morning,   R        04/25/23 1949   04/26/23 0500  Lipase, blood  Tomorrow morning,   R        04/25/23 1949   04/25/23 1956  Protime-INR  Once,   R        04/25/23 1956   04/25/23 1945  HIV Antibody (routine testing w rflx)  (HIV Antibody (Routine testing w reflex) panel)  Once,   R        04/25/23 1949            Vitals/Pain Today's Vitals   04/25/23 1900 04/25/23 1926 04/25/23 1930 04/25/23 1947  BP: (!) 169/92  (!) 178/98   Pulse: (!) 55     Resp:      Temp:    98.5 F (36.9 C)  TempSrc:    Oral  SpO2:      PainSc:  6       Isolation Precautions No active isolations  Medications Medications  piperacillin-tazobactam (ZOSYN) IVPB 3.375 g (3.375 g Intravenous New Bag/Given 04/25/23 1948)  0.9 %  sodium chloride infusion (has no administration in time range)  lactated ringers infusion (has no administration in time range)  acetaminophen (TYLENOL) tablet 650 mg (has no administration in time range)    Or  acetaminophen (TYLENOL) suppository 650 mg (has no administration in time range)  HYDROmorphone (DILAUDID) injection 0.5-2 mg (has no administration in time range)  diphenhydrAMINE (BENADRYL) 12.5 MG/5ML elixir 12.5 mg (has no administration in time range)    Or  diphenhydrAMINE (BENADRYL) injection 12.5 mg (has no administration in time range)  ondansetron (ZOFRAN-ODT) disintegrating tablet 4 mg (has no administration in time range)    Or  ondansetron (  ZOFRAN) injection 4 mg (has no administration in time range)  prochlorperazine (COMPAZINE) tablet 10 mg (has no administration in time range)    Or  prochlorperazine (COMPAZINE) injection 5-10 mg (has no administration in time range)  metoprolol tartrate (LOPRESSOR) injection 5 mg (has  no administration in time range)  heparin injection 5,000 Units (has no administration in time range)  cefTRIAXone (ROCEPHIN) 2 g in sodium chloride 0.9 % 100 mL IVPB (has no administration in time range)  lactated ringers bolus 1,000 mL (has no administration in time range)  lactated ringers bolus 1,000 mL (has no administration in time range)  phenol (CHLORASEPTIC) mouth spray 2 spray (has no administration in time range)  menthol-cetylpyridinium (CEPACOL) lozenge 3 mg (has no administration in time range)  magic mouthwash (has no administration in time range)  alum & mag hydroxide-simeth (MAALOX/MYLANTA) 200-200-20 MG/5ML suspension 30 mL (has no administration in time range)  simethicone (MYLICON) 40 MG/0.6ML suspension 80 mg (has no administration in time range)  polycarbophil (FIBERCON) tablet 625 mg (has no administration in time range)  bisacodyl (DULCOLAX) suppository 10 mg (has no administration in time range)  naphazoline-glycerin (CLEAR EYES REDNESS) ophth solution 1-2 drop (has no administration in time range)  sodium chloride (OCEAN) 0.65 % nasal spray 1-2 spray (has no administration in time range)  Chlorhexidine Gluconate Cloth 2 % PADS 6 each (has no administration in time range)    And  Chlorhexidine Gluconate Cloth 2 % PADS 6 each (has no administration in time range)  feeding supplement (ENSURE PRE-SURGERY) liquid 296 mL (has no administration in time range)  gabapentin (NEURONTIN) capsule 300 mg (has no administration in time range)  acetaminophen (TYLENOL) tablet 1,000 mg (has no administration in time range)  bupivacaine liposome (EXPAREL) 1.3 % injection 266 mg (has no administration in time range)  celecoxib (CELEBREX) capsule 200 mg (has no administration in time range)  sodium chloride 0.9 % bolus 1,000 mL (0 mLs Intravenous Stopped 04/25/23 1349)  ondansetron (ZOFRAN) injection 4 mg (4 mg Intravenous Given 04/25/23 1220)  morphine (PF) 4 MG/ML injection 4 mg (4 mg  Intravenous Given 04/25/23 1221)  morphine (PF) 4 MG/ML injection 4 mg (4 mg Intravenous Given 04/25/23 1429)  iohexol (OMNIPAQUE) 300 MG/ML solution 100 mL (100 mLs Intravenous Contrast Given 04/25/23 1508)    Mobility walks             Neuro Assessment: Exceptions to WDL, A&Ox4. Lives alone. Pleasant. Has cell phone to contact family.     R Recommendations: See Admitting Provider Note  Report given to:   Additional Notes: Ambulatory, uses urinal.

## 2023-04-25 NOTE — H&P (Signed)
[name]  06-04-1976 [id number]  CARE TEAM:  PCP: Pcp, No  Outpatient Care Team: Patient Care Team: Pcp, No as PCP - General  Inpatient Treatment Team: Treatment Team:  Poth, Md, MD Rubye Beach, NT Lala Lund, Paramedic [place], [place], RN McKoy, [place], CMA [facility], Md, MD   This patient is a 47 y.o.male who presents today for surgical evaluation at the request of Dr Dalene Seltzer, [facility] ED.   Chief complaint / Reason for evaluation: Nausea vomiting abdominal pain and probable cholecystitis  47 year old morbidly obese male.  Mostly healthy.  Prior fracture on warfarin 10 years ago.  Developed nausea and vomiting abdominal pain after eating a cold throughout having a big steak yesterday.  Did not feel better.  Gradually worsened.  Came emergency department.  History physical and CT scan suspicious for cholecystitis.  Attempt made to control pain and nausea and symptoms but having persistent discomfort.  Concerned this is more than just a gallbladder attack with persistent cholecystitis.  No major cardiac or pulmonary issues or diabetes.  No prior abdominal surgeries.  No severe constipation or diarrhea or history inflammatory bowel disease.  No recent travel or change in diet or sick contacts.  No major heartburn or reflux.   Assessment  [name]  47 y.o. male       Problem List:  Principal Problem:   Acute calculous cholecystitis Active Problems:   Hyperglycemia   Morbid obesity with BMI of 45.0-49.9, adult (HCC)   Rather classic story biliary colic with known large gallstones with persistent discomfort and some inflammation concerning for cholecystitis  Plan:  IV fluids  Nausea pain control.  IV antibiotics.  Admission  Probable cholecystectomy tomorrow.  Check INR to make sure he is not on warfarin anymore.  Very borderline hyperglycemia check he will and A1c to rule out diabetes.  -VTE prophylaxis- SCDs, etc -mobilize as tolerated to  help recovery  I reviewed nursing notes, ED provider notes, last 24 h vitals and pain scores, last 48 h intake and output, last 24 h labs and trends, and last 24 h imaging results. I have reviewed this patient's available data, including medical history, events of note, test results, etc as part of my evaluation.  A significant portion of that time was spent in counseling.  Care during the described time interval was provided by me.  This care required moderate level of medical decision making.  04/25/2023  Ardeth Sportsman, MD, FACS, MASCRS Esophageal, Gastrointestinal & Colorectal Surgery Robotic and Minimally Invasive Surgery  [facility] [address] [place], [place] [address] [phone] Fax [phone] Main  CONTACT INFORMATION: Weekday (9AM-5PM): Call [facility] main office at [phone] Weeknight (5PM-9AM) or Weekend/Holiday: Check EPIC "Web Links" tab & use "AMION" (password " [email or web address]") for General Surgery [facility] coverage  Please, DO NOT use SecureChat  (it is not reliable communication to reach operating surgeons & will lead to a delay in care).   Epic staff messaging available for outptient concerns needing 1-2 business day response.      04/25/2023      Past Medical History:  Diagnosis Date   No pertinent past medical history     Past Surgical History:  Procedure Laterality Date   NO PAST SURGERIES     TIBIA IM NAIL INSERTION  08/25/2012   Procedure: INTRAMEDULLARY (IM) NAIL TIBIAL;  Surgeon: Eldred Manges, MD;  Location: [facility] OR;  Service:  Orthopedics;  Laterality: Right;    Social History   Socioeconomic History   Marital status: Single    Spouse name: Not on file   Number of children: Not on file   Years of education: Not on file   Highest education level: Not on file  Occupational History   Not on file  Tobacco Use   Smoking status: Never   Smokeless tobacco: Not on file  Substance and  Sexual Activity   Alcohol use: No   Drug use: No   Sexual activity: Not on file  Other Topics Concern   Not on file  Social History Narrative   Not on file   Social Determinants of Health   Financial Resource Strain: Not on file  Food Insecurity: Not on file  Transportation Needs: Not on file  Physical Activity: Not on file  Stress: Not on file  Social Connections: Not on file  Intimate Partner Violence: Not on file    No family history on file.  Current Facility-Administered Medications  Medication Dose Route Frequency Provider Last Rate Last Admin   0.9 %  sodium chloride infusion   Intravenous Q8H PRN Karie Soda, MD       acetaminophen (TYLENOL) tablet 650 mg  650 mg Oral Q6H PRN Karie Soda, MD       Or   acetaminophen (TYLENOL) suppository 650 mg  650 mg Rectal Q6H PRN Karie Soda, MD       [START ON 04/26/2023] acetaminophen (TYLENOL) tablet 1,000 mg  1,000 mg Oral On Call to OR Karie Soda, MD       alum & mag hydroxide-simeth (MAALOX/MYLANTA) 200-200-20 MG/5ML suspension 30 mL  30 mL Oral Q6H PRN Karie Soda, MD       bisacodyl (DULCOLAX) suppository 10 mg  10 mg Rectal Q12H PRN Karie Soda, MD       bupivacaine liposome (EXPAREL) 1.3 % injection 266 mg  20 mL Infiltration Once Karie Soda, MD       cefTRIAXone (ROCEPHIN) 2 g in sodium chloride 0.9 % 100 mL IVPB  2 g Intravenous Q24H Karie Soda, MD       Melene Muller ON 04/26/2023] celecoxib (CELEBREX) capsule 200 mg  200 mg Oral On Call to OR Karie Soda, MD       Chlorhexidine Gluconate Cloth 2 % PADS 6 each  6 each Topical Once Karie Soda, MD       And   Chlorhexidine Gluconate Cloth 2 % PADS 6 each  6 each Topical Once Karie Soda, MD       diphenhydrAMINE (BENADRYL) 12.5 MG/5ML elixir 12.5 mg  12.5 mg Oral Q6H PRN Karie Soda, MD       Or   diphenhydrAMINE (BENADRYL) injection 12.5 mg  12.5 mg Intravenous Q6H PRN Karie Soda, MD       Melene Muller ON 04/26/2023] feeding supplement (ENSURE  PRE-SURGERY) liquid 296 mL  296 mL Oral Once Karie Soda, MD       Melene Muller ON 04/26/2023] gabapentin (NEURONTIN) capsule 300 mg  300 mg Oral On Call to OR Karie Soda, MD       heparin injection 5,000 Units  5,000 Units Subcutaneous Trixie Deis, MD       HYDROmorphone (DILAUDID) injection 0.5-2 mg  0.5-2 mg Intravenous Q2H PRN Karie Soda, MD       lactated ringers bolus 1,000 mL  1,000 mL Intravenous Once Karie Soda, MD       lactated ringers bolus 1,000 mL  1,000  mL Intravenous Q8H PRN Karie Soda, MD       lactated ringers infusion   Intravenous Continuous Karie Soda, MD       magic mouthwash  15 mL Oral QID PRN Karie Soda, MD       menthol-cetylpyridinium (CEPACOL) lozenge 3 mg  1 lozenge Oral PRN Karie Soda, MD       metoprolol tartrate (LOPRESSOR) injection 5 mg  5 mg Intravenous Q6H PRN Karie Soda, MD       naphazoline-glycerin (CLEAR EYES REDNESS) ophth solution 1-2 drop  1-2 drop Both Eyes QID PRN Karie Soda, MD       ondansetron (ZOFRAN-ODT) disintegrating tablet 4 mg  4 mg Oral Q6H PRN Karie Soda, MD       Or   ondansetron [facility]) injection 4 mg  4 mg Intravenous Q6H PRN Karie Soda, MD       phenol (CHLORASEPTIC) mouth spray 2 spray  2 spray Mouth/Throat PRN Karie Soda, MD       piperacillin-tazobactam (ZOSYN) IVPB 3.375 g  3.375 g Intravenous Once Winfield Rast, RPH 100 mL/hr at 04/25/23 1948 3.375 g at 04/25/23 1948   polycarbophil (FIBERCON) tablet 625 mg  625 mg Oral BID Karie Soda, MD       prochlorperazine (COMPAZINE) tablet 10 mg  10 mg Oral Q6H PRN Karie Soda, MD       Or   prochlorperazine (COMPAZINE) injection 5-10 mg  5-10 mg Intravenous Q6H PRN Karie Soda, MD       simethicone (MYLICON) 40 MG/0.6ML suspension 80 mg  80 mg Oral QID PRN Karie Soda, MD       sodium chloride (OCEAN) 0.65 % nasal spray 1-2 spray  1-2 spray Each Nare Q6H PRN Karie Soda, MD       Current Outpatient Medications  Medication Sig Dispense  Refill   methocarbamol (ROBAXIN) 500 MG tablet Take 1 tablet (500 mg total) by mouth every 6 (six) hours as needed. 60 tablet 0   oxyCODONE-acetaminophen (PERCOCET/ROXICET) 5-325 MG tablet Take 1-2 tablets by mouth every 6 (six) hours as needed for severe pain. 8 tablet 0   penicillin v potassium (VEETID) 500 MG tablet Take 1 tablet (500 mg total) by mouth 3 (three) times daily. 30 tablet 0   warfarin (COUMADIN) 5 MG tablet Take 1 tablet (5 mg total) by mouth daily at 6 PM. 30 tablet 0     No Known Allergies  ROS:   All other systems reviewed & are negative except per HPI or as noted below: Constitutional:  No fevers, chills, sweats.  Weight stable Eyes:  No vision changes, No discharge HENT:  No sore throats, nasal drainage Lymph: No neck swelling, No bruising easily Pulmonary:  No cough, productive sputum CV: No orthopnea, PND  Patient walks 20 minutes without difficulty.  No exertional chest/neck/shoulder/arm pain.  GI:  No personal nor family history of GI/colon cancer, inflammatory bowel disease, irritable bowel syndrome, allergy such as Celiac Sprue, dietary/dairy problems, colitis, ulcers nor gastritis.  No recent sick contacts/gastroenteritis.  No travel outside the country.  No changes in diet.  Renal: No UTIs, No hematuria Genital:  No drainage, bleeding, masses Musculoskeletal: No severe joint pain.  Good ROM major joints Skin:  No sores or lesions.  Concern for irritation/blood in his bellybutton Heme/Lymph:  No easy bleeding.  No swollen lymph nodes   BP (!) 178/98   Pulse (!) 55   Temp 98.5 F (36.9 C) (Oral)   Resp 18  SpO2 97%      Results:   Labs: Results for orders placed or performed during the hospital encounter of 04/25/23 (from the past 48 hour(s))  CBC with Differential     Status: Abnormal   Collection Time: 04/25/23 12:25 PM  Result Value Ref Range   WBC 7.7 4.0 - 10.5 K/uL   RBC 5.23 4.22 - 5.81 MIL/uL   Hemoglobin 14.2 13.0 - 17.0 g/dL   HCT  16.1 09.6 - 04.5 %   MCV 87.0 80.0 - 100.0 fL   MCH 27.2 26.0 - 34.0 pg   MCHC 31.2 30.0 - 36.0 g/dL   RDW 40.9 81.1 - 91.4 %   Platelets 212 150 - 400 K/uL   nRBC 0.0 0.0 - 0.2 %   Neutrophils Relative % 88 %   Neutro Abs 6.8 1.7 - 7.7 K/uL   Lymphocytes Relative 7 %   Lymphs Abs 0.5 (L) 0.7 - 4.0 K/uL   Monocytes Relative 4 %   Monocytes Absolute 0.3 0.1 - 1.0 K/uL   Eosinophils Relative 0 %   Eosinophils Absolute 0.0 0.0 - 0.5 K/uL   Basophils Relative 0 %   Basophils Absolute 0.0 0.0 - 0.1 K/uL   Immature Granulocytes 1 %   Abs Immature Granulocytes 0.04 0.00 - 0.07 K/uL    Comment: Performed at [facility], [address], [place], [place] [address]  Comprehensive metabolic panel     Status: Abnormal   Collection Time: 04/25/23 12:25 PM  Result Value Ref Range   Sodium 136 135 - 145 mmol/L   Potassium 3.7 3.5 - 5.1 mmol/L   Chloride 99 98 - 111 mmol/L   CO2 27 22 - 32 mmol/L   Glucose, Bld 126 (H) 70 - 99 mg/dL    Comment: Glucose reference range applies only to samples taken after fasting for at least 8 hours.   BUN 15 6 - 20 mg/dL   Creatinine, Ser 6.21 0.61 - 1.24 mg/dL   Calcium 8.9 8.9 - 30.8 mg/dL   Total Protein 7.7 6.5 - 8.1 g/dL   Albumin 3.9 3.5 - 5.0 g/dL   AST 24 15 - 41 U/L   ALT 26 0 - 44 U/L   Alkaline Phosphatase 99 38 - 126 U/L   Total Bilirubin 0.5 0.3 - 1.2 mg/dL   GFR, Estimated >65 >78 mL/min    Comment: (NOTE) Calculated using the CKD-EPI Creatinine Equation (2021)    Anion gap 10 5 - 15    Comment: Performed at [facility], [address], [place], [place] [address]  Lipase, blood     Status: None   Collection Time: 04/25/23 12:25 PM  Result Value Ref Range   Lipase 41 11 - 51 U/L    Comment: Performed at [facility], [address], [place], [place] [address]  Urinalysis, Routine w reflex microscopic -Urine, Clean Catch     Status: None   Collection Time: 04/25/23  5:50 PM   Result Value Ref Range   Color, Urine YELLOW YELLOW   APPearance CLEAR CLEAR   Specific Gravity, Urine 1.030 1.005 - 1.030   pH 7.0 5.0 - 8.0   Glucose, UA NEGATIVE NEGATIVE mg/dL   Hgb urine dipstick NEGATIVE NEGATIVE   Bilirubin Urine NEGATIVE NEGATIVE   Ketones, ur NEGATIVE NEGATIVE mg/dL   Protein, ur NEGATIVE NEGATIVE mg/dL   Nitrite NEGATIVE NEGATIVE   Leukocytes,Ua NEGATIVE NEGATIVE   RBC / HPF 0-5 0 - 5  RBC/hpf   WBC, UA 0-5 0 - 5 WBC/hpf   Bacteria, UA NONE SEEN NONE SEEN   Squamous Epithelial / HPF 0-5 0 - 5 /HPF   Mucus PRESENT     Comment: Performed at [facility], [address] [place], [place] [address]    Imaging / Studies: CT ABDOMEN PELVIS W CONTRAST  Result Date: 04/25/2023 CLINICAL DATA:  Abdominal pain with nausea and vomiting starting last night. EXAM: CT ABDOMEN AND PELVIS WITH CONTRAST TECHNIQUE: Multidetector CT imaging of the abdomen and pelvis was performed using the standard protocol following bolus administration of intravenous contrast. RADIATION DOSE REDUCTION: This exam was performed according to the departmental dose-optimization program which includes automated exposure control, adjustment of the mA and/or kV according to patient size and/or use of iterative reconstruction technique. CONTRAST:  OMNIPAQUE IOHEXOL 300 MG/ML  SOLN COMPARISON:  08/25/2012 FINDINGS: Lower chest: Upper normal heart size. 5 mm right middle lobe pulmonary nodule on image 11 series 4, previously measuring at mm on 08/25/2012, essentially stable. Likewise stable is a 4 mm posterior basal segment left lower lobe nodule on image 24 series 4. No further imaging workup of these lesions is indicated. Hepatobiliary: Suspected hepatic steatosis. 3.3 cm gallstone in the gallbladder. Hazy stranding around the gallbladder borderline gallbladder wall thickening, cholecystitis is not excluded. A likely reactive porta hepatis lymph node adjacent to the gallbladder  measures 0.1 cm in short axis on image 69 series 5, mildly prominent. Otherwise unremarkable. Pancreas: Unremarkable Spleen: Unremarkable Adrenals/Urinary Tract: 8 mm fluid density lesion of the left mid upper kidney compatible with Bosniak category 1 simple cyst. No further imaging workup of this lesion is indicated. Adrenal glands in urinary bladder unremarkable. Stomach/Bowel: Unremarkable Vascular/Lymphatic: 0.2 cm right external iliac lymph node on image 91 series 2, previously 0.6 cm. Right inguinal lymph node 1.4 cm in short axis on image 99 series 2, upper normal size, previously 0.9 cm. Additional upper normal bilateral inguinal lymph nodes are present. Reproductive: Unremarkable Other: No supplemental non-categorized findings. Musculoskeletal: Unremarkable IMPRESSION: 1. 3.3 cm gallstone in the gallbladder with borderline gallbladder wall thickening and hazy stranding around the gallbladder, cholecystitis is not excluded. Correlate with symptoms in determining whether further workup such as ultrasound or nuclear medicine hepatobiliary scan may be helpful. 2. Suspected hepatic steatosis. 3. Upper normal heart size. 4. Mildly prominent right external iliac node with upper normal sized inguinal lymph nodes. This appearance is nonspecific although these may be reactive. Electronically Signed   By: Gaylyn Rong M.D.   On: 04/25/2023 17:07    Medications / Allergies: per chart  Antibiotics: Anti-infectives (From admission, onward)    Start     Dose/Rate Route Frequency Ordered Stop   04/25/23 2000  cefTRIAXone (ROCEPHIN) 2 g in sodium chloride 0.9 % 100 mL IVPB        2 g 200 mL/hr over 30 Minutes Intravenous Every 24 hours 04/25/23 1949 05/02/23 1959   04/25/23 1945  piperacillin-tazobactam (ZOSYN) IVPB 3.375 g        3.375 g 100 mL/hr over 30 Minutes Intravenous  Once 04/25/23 1937           Note: Portions of this report may have been transcribed using voice recognition software.  Every effort was made to ensure accuracy; however, inadvertent computerized transcription errors may be present.   Any transcriptional errors that result from this process are unintentional.    Ardeth Sportsman, MD, FACS, MASCRS Esophageal, Gastrointestinal & Colorectal Surgery Robotic  and Minimally Invasive Surgery  [facility] [address] [place], [place] [address] [phone] Fax [phone] Main  CONTACT INFORMATION: Weekday (9AM-5PM): Call [facility] main office at [phone] Weeknight (5PM-9AM) or Weekend/Holiday: Check EPIC "Web Links" tab & use "AMION" (password " [email or web address]") for General Surgery [facility] coverage  Please, DO NOT use SecureChat  (it is not reliable communication to reach operating surgeons & will lead to a delay in care).   Epic staff messaging available for outptient concerns needing 1-2 business day response.       04/25/2023  7:56 PM

## 2023-04-25 NOTE — ED Provider Notes (Signed)
EMERGENCY DEPARTMENT AT [facility] Provider Note   CSN: [id number] Arrival date & time: 04/25/23  1121     History  Chief Complaint  Patient presents with   Abdominal Pain    [name] is a 47 y.o. male.  Pt is a 47 yo male with no significant pmhx.  Pt said he's been having abd pain and n/v today.  He did eat a steak at [employer] last night.  He noticed some bleeding from his "belly hole."  No swelling.       Home Medications Prior to Admission medications   Medication Sig Start Date End Date Taking? Authorizing Provider  methocarbamol (ROBAXIN) 500 MG tablet Take 1 tablet (500 mg total) by mouth every 6 (six) hours as needed. 09/02/12   Angiulli, Mcarthur Rossetti, PA-C  oxyCODONE-acetaminophen (PERCOCET/ROXICET) 5-325 MG tablet Take 1-2 tablets by mouth every 6 (six) hours as needed for severe pain. 12/31/15   Fayrene Helper, PA-C  penicillin v potassium (VEETID) 500 MG tablet Take 1 tablet (500 mg total) by mouth 3 (three) times daily. 12/31/15   Fayrene Helper, PA-C  warfarin (COUMADIN) 5 MG tablet Take 1 tablet (5 mg total) by mouth daily at 6 PM. 09/02/12   Angiulli, Mcarthur Rossetti, PA-C      Allergies    Patient has no known allergies.    Review of Systems   Review of Systems  Gastrointestinal:  Positive for abdominal pain, nausea and vomiting.  All other systems reviewed and are negative.   Physical Exam Updated Vital Signs BP (!) 156/93 (BP Location: Right Arm)   Pulse (!) 59   Temp 98.5 F (36.9 C) (Oral)   Resp (!) 22   SpO2 99%  Physical Exam Vitals and nursing note reviewed.  Constitutional:      Appearance: He is well-developed. He is obese.  HENT:     Head: Normocephalic and atraumatic.     Mouth/Throat:     Mouth: Mucous membranes are moist.  Eyes:     Extraocular Movements: Extraocular movements intact.     Pupils: Pupils are equal, round, and reactive to light.  Cardiovascular:     Rate and Rhythm: Normal rate and regular rhythm.      Heart sounds: Normal heart sounds.  Pulmonary:     Effort: Pulmonary effort is normal.     Breath sounds: Normal breath sounds.  Abdominal:     General: Abdomen is flat. Bowel sounds are normal.     Tenderness: There is generalized abdominal tenderness.     Hernia: No hernia is present.  Skin:    General: Skin is warm.     Capillary Refill: Capillary refill takes less than 2 seconds.  Neurological:     General: No focal deficit present.     Mental Status: He is alert and oriented to person, place, and time.  Psychiatric:        Mood and Affect: Mood normal.        Behavior: Behavior normal.     ED Results / Procedures / Treatments   Labs (all labs ordered are listed, but only abnormal results are displayed) Labs Reviewed  CBC WITH DIFFERENTIAL/PLATELET - Abnormal; Notable for the following components:      Result Value   Lymphs Abs 0.5 (*)    All other components within normal limits  COMPREHENSIVE METABOLIC PANEL - Abnormal; Notable for the following components:   Glucose, Bld 126 (*)    All other  components within normal limits  LIPASE, BLOOD  URINALYSIS, ROUTINE W REFLEX MICROSCOPIC    EKG None  Radiology No results found.  Procedures Procedures    Medications Ordered in ED Medications  sodium chloride 0.9 % bolus 1,000 mL (0 mLs Intravenous Stopped 04/25/23 1349)  ondansetron (ZOFRAN) injection 4 mg (4 mg Intravenous Given 04/25/23 1220)  morphine (PF) 4 MG/ML injection 4 mg (4 mg Intravenous Given 04/25/23 1221)  morphine (PF) 4 MG/ML injection 4 mg (4 mg Intravenous Given 04/25/23 1429)  iohexol (OMNIPAQUE) 300 MG/ML solution 100 mL (100 mLs Intravenous Contrast Given 04/25/23 1508)    ED Course/ Medical Decision Making/ A&P                                 Medical Decision Making Amount and/or Complexity of Data Reviewed Labs: ordered. Radiology: ordered.  Risk Prescription drug management.   This patient presents to the ED for concern of abd  pain, this involves an extensive number of treatment options, and is a complaint that carries with it a high risk of complications and morbidity.  The differential diagnosis includes gastritis, cholecystitis, sbo   Co morbidities that complicate the patient evaluation  none   Additional history obtained:  Additional history obtained from epic chart review  Lab Tests:  I Ordered, and personally interpreted labs.  The pertinent results include:  cbc nl, cmp nl, lip nl   Imaging Studies ordered:  I ordered imaging studies including ct abd/pelvis  Pending at shift change   Cardiac Monitoring:  The patient was maintained on a cardiac monitor.  I personally viewed and interpreted the cardiac monitored which showed an underlying rhythm of: nsr   Medicines ordered and prescription drug management:  I ordered medication including ivfs/morphine/zofran  for sx  Reevaluation of the patient after these medicines showed that the patient improved I have reviewed the patients home medicines and have made adjustments as needed   Test Considered:  ct     Problem List / ED Course:  Abd pain   Reevaluation:  After the interventions noted above, I reevaluated the patient and found that they have :improved   Social Determinants of Health:  Lives at home   Dispostion:  Pending at shift change        Final Clinical Impression(s) / ED Diagnoses Final diagnoses:  Generalized abdominal pain    Rx / DC Orders ED Discharge Orders     None         Jacalyn Lefevre, MD 04/25/23 [phone]

## 2023-04-25 NOTE — ED Notes (Signed)
Patient made aware multiple times urine is needed states he does not have to go.

## 2023-04-26 ENCOUNTER — Encounter (HOSPITAL_COMMUNITY): Payer: Self-pay

## 2023-04-26 ENCOUNTER — Inpatient Hospital Stay (HOSPITAL_COMMUNITY): Payer: BLUE CROSS/BLUE SHIELD

## 2023-04-26 ENCOUNTER — Inpatient Hospital Stay (HOSPITAL_COMMUNITY): Payer: BLUE CROSS/BLUE SHIELD | Admitting: Anesthesiology

## 2023-04-26 ENCOUNTER — Encounter (HOSPITAL_COMMUNITY)
Admission: EM | Disposition: A | Discharge status: Home / Self Care | Payer: Self-pay | Source: Home / Self Care | Attending: Emergency Medicine

## 2023-04-26 ENCOUNTER — Other Ambulatory Visit: Payer: Self-pay

## 2023-04-26 DIAGNOSIS — K8012 Calculus of gallbladder with acute and chronic cholecystitis without obstruction: Secondary | ICD-10-CM | POA: Diagnosis not present

## 2023-04-26 DIAGNOSIS — R739 Hyperglycemia, unspecified: Secondary | ICD-10-CM | POA: Diagnosis not present

## 2023-04-26 DIAGNOSIS — Z6841 Body Mass Index (BMI) 40.0 and over, adult: Secondary | ICD-10-CM | POA: Diagnosis not present

## 2023-04-26 HISTORY — PX: CHOLECYSTECTOMY: SHX55

## 2023-04-26 LAB — HEPATIC FUNCTION PANEL
ALT: 23 U/L (ref 0–44)
AST: 22 U/L (ref 15–41)
Albumin: 3.2 g/dL — ABNORMAL LOW (ref 3.5–5.0)
Alkaline Phosphatase: 79 U/L (ref 38–126)
Bilirubin, Direct: 0.2 mg/dL (ref 0.0–0.2)
Indirect Bilirubin: 0.6 mg/dL (ref 0.3–0.9)
Total Bilirubin: 0.8 mg/dL (ref 0.3–1.2)
Total Protein: 6.5 g/dL (ref 6.5–8.1)

## 2023-04-26 LAB — HEMOGLOBIN A1C
Hgb A1c MFr Bld: 5.9 % — ABNORMAL HIGH (ref 4.8–5.6)
Mean Plasma Glucose: 122.63 mg/dL

## 2023-04-26 LAB — CBC
HCT: 41.1 % (ref 39.0–52.0)
Hemoglobin: 12.6 g/dL — ABNORMAL LOW (ref 13.0–17.0)
MCH: 27 pg (ref 26.0–34.0)
MCHC: 30.7 g/dL (ref 30.0–36.0)
MCV: 88 fL (ref 80.0–100.0)
Platelets: 195 10*3/uL (ref 150–400)
RBC: 4.67 MIL/uL (ref 4.22–5.81)
RDW: 13.5 % (ref 11.5–15.5)
WBC: 12.5 10*3/uL — ABNORMAL HIGH (ref 4.0–10.5)
nRBC: 0 % (ref 0.0–0.2)

## 2023-04-26 LAB — LIPASE, BLOOD: Lipase: 38 U/L (ref 11–51)

## 2023-04-26 LAB — CREATININE, SERUM
Creatinine, Ser: 0.99 mg/dL (ref 0.61–1.24)
GFR, Estimated: >60 mL/min (ref >60)

## 2023-04-26 LAB — POTASSIUM: Potassium: 3.6 mmol/L (ref 3.5–5.1)

## 2023-04-26 LAB — HIV ANTIBODY (ROUTINE TESTING W REFLEX): HIV Screen 4th Generation wRfx: NONREACTIVE

## 2023-04-26 SURGERY — LAPAROSCOPIC CHOLECYSTECTOMY WITH INTRAOPERATIVE CHOLANGIOGRAM
Anesthesia: General

## 2023-04-26 MED ORDER — PROPOFOL 10 MG/ML IV BOLUS
INTRAVENOUS | Status: DC | PRN
  Administered 2023-04-26: 180 mg via INTRAVENOUS

## 2023-04-26 MED ORDER — SUGAMMADEX SODIUM 200 MG/2ML IV SOLN
INTRAVENOUS | Status: DC | PRN
  Administered 2023-04-26: 400 mg via INTRAVENOUS

## 2023-04-26 MED ORDER — MIDAZOLAM HCL 2 MG/2ML IJ SOLN
INTRAMUSCULAR | Status: AC
  Filled 2023-04-26: qty 2

## 2023-04-26 MED ORDER — FENTANYL CITRATE (PF) 250 MCG/5ML IJ SOLN
INTRAMUSCULAR | Status: AC
  Filled 2023-04-26: qty 5

## 2023-04-26 MED ORDER — FENTANYL CITRATE (PF) 100 MCG/2ML IJ SOLN
INTRAMUSCULAR | Status: DC | PRN
  Administered 2023-04-26: 50 ug via INTRAVENOUS
  Administered 2023-04-26: 100 ug via INTRAVENOUS

## 2023-04-26 MED ORDER — ROCURONIUM BROMIDE 10 MG/ML (PF) SYRINGE
PREFILLED_SYRINGE | INTRAVENOUS | Status: DC | PRN
  Administered 2023-04-26: 40 mg via INTRAVENOUS
  Administered 2023-04-26: 60 mg via INTRAVENOUS

## 2023-04-26 MED ORDER — BUPIVACAINE-EPINEPHRINE 0.25% -1:200000 IJ SOLN
INTRAMUSCULAR | Status: DC | PRN
Start: 2023-04-26 — End: 2023-04-26
  Administered 2023-04-26: 30 mL

## 2023-04-26 MED ORDER — LACTATED RINGERS IV SOLN: INTRAVENOUS | Status: DC

## 2023-04-26 MED ORDER — LACTATED RINGERS IR SOLN
Status: DC | PRN
  Administered 2023-04-26: 1000 mL

## 2023-04-26 MED ORDER — HEMOSTATIC AGENTS (NO CHARGE) OPTIME
TOPICAL | Status: DC | PRN
  Administered 2023-04-26: 1 via TOPICAL

## 2023-04-26 MED ORDER — DEXAMETHASONE SODIUM PHOSPHATE 4 MG/ML IJ SOLN
INTRAMUSCULAR | Status: DC | PRN
  Administered 2023-04-26: 10 mg via INTRAVENOUS

## 2023-04-26 MED ORDER — DEXMEDETOMIDINE HCL IN NACL 80 MCG/20ML IV SOLN
INTRAVENOUS | Status: DC | PRN
  Administered 2023-04-26: 12 ug via INTRAVENOUS

## 2023-04-26 MED ORDER — MIDAZOLAM HCL 5 MG/5ML IJ SOLN
INTRAMUSCULAR | Status: DC | PRN
  Administered 2023-04-26: 2 mg via INTRAVENOUS

## 2023-04-26 MED ORDER — SUCCINYLCHOLINE CHLORIDE 200 MG/10ML IV SOSY
PREFILLED_SYRINGE | INTRAVENOUS | Status: DC | PRN
  Administered 2023-04-26: 160 mg via INTRAVENOUS

## 2023-04-26 MED ORDER — CHLORHEXIDINE GLUCONATE 0.12 % MT SOLN
15.0000 mL | Freq: Once | OROMUCOSAL | Status: AC
  Administered 2023-04-26: 15 mL via OROMUCOSAL

## 2023-04-26 MED ORDER — 0.9 % SODIUM CHLORIDE (POUR BTL) OPTIME
TOPICAL | Status: DC | PRN
  Administered 2023-04-26: 1000 mL

## 2023-04-26 MED ORDER — LIDOCAINE 2% (20 MG/ML) 5 ML SYRINGE
INTRAMUSCULAR | Status: DC | PRN
  Administered 2023-04-26: 100 mg via INTRAVENOUS

## 2023-04-26 MED ORDER — BUPIVACAINE-EPINEPHRINE 0.25% -1:200000 IJ SOLN
INTRAMUSCULAR | Status: AC
  Filled 2023-04-26: qty 1

## 2023-04-26 MED ORDER — OXYCODONE HCL 5 MG PO TABS: 5.0000 mg | ORAL_TABLET | ORAL | 0 refills | Status: DC | PRN

## 2023-04-26 SURGICAL SUPPLY — 40 items
ADH SKN CLS APL DERMABOND .7 (GAUZE/BANDAGES/DRESSINGS)
APL PRP STRL LF DISP 70% ISPRP (MISCELLANEOUS) ×2
APPLIER CLIP ROT 10 11.4 M/L (STAPLE) ×1
APR CLP MED LRG 11.4X10 (STAPLE) ×1
BAG COUNTER SPONGE SURGICOUNT (BAG) IMPLANT
BAG SPNG CNTER NS LX DISP (BAG)
CABLE HIGH FREQUENCY MONO STRZ (ELECTRODE) ×1 IMPLANT
CHLORAPREP W/TINT 26 (MISCELLANEOUS) ×2 IMPLANT
CLIP APPLIE ROT 10 11.4 M/L (STAPLE) ×1 IMPLANT
COVER MAYO STAND XLG (MISCELLANEOUS) ×1 IMPLANT
COVER SURGICAL LIGHT HANDLE (MISCELLANEOUS) ×1 IMPLANT
DERMABOND ADVANCED .7 DNX12 (GAUZE/BANDAGES/DRESSINGS) IMPLANT
DRAPE C-ARM 42X120 X-RAY (DRAPES) ×1 IMPLANT
ELECT PENCIL ROCKER SW 15FT (MISCELLANEOUS) IMPLANT
ELECT REM PT RETURN 15FT ADLT (MISCELLANEOUS) ×1 IMPLANT
GAUZE SPONGE 2X2 8PLY STRL LF (GAUZE/BANDAGES/DRESSINGS) ×1 IMPLANT
GLOVE SURG ORTHO 8.0 STRL STRW (GLOVE) ×1 IMPLANT
GLOVE SURG SYN 7.5 E (GLOVE) ×2 IMPLANT
GLOVE SURG SYN 7.5 PF PI (GLOVE) ×2 IMPLANT
GOWN STRL REUS W/ TWL XL LVL3 (GOWN DISPOSABLE) ×1 IMPLANT
GOWN STRL REUS W/TWL XL LVL3 (GOWN DISPOSABLE) ×1
HEMOSTAT SNOW SURGICEL 2X4 (HEMOSTASIS) IMPLANT
IRRIG SUCT STRYKERFLOW 2 WTIP (MISCELLANEOUS) ×1
IRRIGATION SUCT STRKRFLW 2 WTP (MISCELLANEOUS) ×1 IMPLANT
KIT BASIN OR (CUSTOM PROCEDURE TRAY) ×1 IMPLANT
KIT TURNOVER KIT A (KITS) IMPLANT
SCISSORS LAP 5X35 DISP (ENDOMECHANICALS) ×1 IMPLANT
SET CHOLANGIOGRAPH MIX (MISCELLANEOUS) ×1 IMPLANT
SET TUBE SMOKE EVAC HIGH FLOW (TUBING) ×1 IMPLANT
SLEEVE Z-THREAD 5X100MM (TROCAR) ×1 IMPLANT
SPIKE FLUID TRANSFER (MISCELLANEOUS) ×1 IMPLANT
SUT MNCRL AB 4-0 PS2 18 (SUTURE) ×1 IMPLANT
SYS BAG RETRIEVAL 10MM (BASKET) ×1
SYSTEM BAG RETRIEVAL 10MM (BASKET) ×1 IMPLANT
TOWEL OR 17X26 10 PK STRL BLUE (TOWEL DISPOSABLE) ×1 IMPLANT
TOWEL OR NON WOVEN STRL DISP B (DISPOSABLE) ×1 IMPLANT
TRAY LAPAROSCOPIC (CUSTOM PROCEDURE TRAY) ×1 IMPLANT
TROCAR 11X100 Z THREAD (TROCAR) ×1 IMPLANT
TROCAR BALLN 12MMX100 BLUNT (TROCAR) ×1 IMPLANT
TROCAR Z-THREAD OPTICAL 5X100M (TROCAR) ×1 IMPLANT

## 2023-04-26 NOTE — Op Note (Signed)
Procedure Note  Pre-operative Diagnosis:  chronic cholecystitis, cholelithiasis, abdominal pain  Post-operative Diagnosis:  same  Surgeon:  Darnell Level, MD  Assistant:  none   Procedure:  Laparoscopic cholecystectomy  Anesthesia:  General  Estimated Blood Loss:  25 cc  Drains: none         Specimen: gallbladder to pathology  Indications:  47 year old morbidly obese male. Mostly healthy. Prior fracture on warfarin 10 years ago. Developed nausea and vomiting abdominal pain after eating a cold throughout having a big steak yesterday. Did not feel better. Gradually worsened. Came emergency department. History physical and CT scan suspicious for cholecystitis. Attempt made to control pain and nausea and symptoms but having persistent discomfort. Concerned this is more than just a gallbladder attack with persistent cholecystitis.  Patient now comes to surgery for cholecystectomy.  Procedure description: The patient was seen in the pre-op holding area. The risks, benefits, complications, treatment options, and expected outcomes were previously discussed with the patient. The patient agreed with the proposed plan and has signed the informed consent form.  The patient was transported to operating room #4 at the [facility]. The patient was placed in the supine position on the operating room table. Following induction of general anesthesia, the abdomen was prepped and draped in the usual aseptic fashion.  An incision was made in the skin near the umbilicus. The midline fascia was incised and the peritoneal cavity was entered and a Hasson cannula was introduced under direct vision. The cannula was secured with a 0-Vicryl pursestring suture. Pneumoperitoneum was established with carbon dioxide. Additional cannulae were introduced under direct vision along the right costal margin in the midline, mid-clavicular line, and anterior axillary line.   The gallbladder was identified and the fundus  grasped and retracted cephalad. Adhesions were taken down bluntly and the electrocautery was utilized as needed, taking care not to involve any adjacent structures.  The gallbladder was markedly contracted and thick-walled.  It appeared to contain large gallstones.  The wall was difficult to grasp and therefore the gallbladder was aspirated.  Exposure was quite difficult due to the patient's body habitus and large amount of adipose tissue within the abdominal cavity.  The gallbladder was elevated enough to visualize the neck of the gallbladder.  Dissection was carefully carried down the neck of the gallbladder and the cystic duct was identified, doubly clipped, and divided.  Branches of the cystic artery were identified, clipped, and divided with the hook electrocautery.  The gallbladder was dissected away from the gallbladder bed using the electrocautery for hemostasis. The gallbladder was completely removed from the liver and placed into an endocatch bag. The gallbladder was removed in the endocatch bag through the umbilical port site and submitted to pathology for review.  The right upper quadrant was irrigated and the gallbladder bed was inspected. Hemostasis was achieved with the electrocautery.  A full piece of snow Surgicel was placed in the gallbladder bed.  Cannulae were removed under direct vision and good hemostasis was noted. Pneumoperitoneum was released and the majority of the carbon dioxide evacuated. The umbilical wound was irrigated and the fascia was then closed with the pursestring suture.  Local anesthetic was infiltrated at all port sites. Skin incisions were closed with 4-0 Monocril subcuticular sutures and Dermabond was applied.  Instrument, sponge, and needle counts were correct at the conclusion of the case.  The patient was awakened from anesthesia and brought to the recovery room in stable condition.  The patient tolerated the procedure well.  Darnell Level, MD [facility]  Surgery Office: [phone]

## 2023-04-26 NOTE — Anesthesia Preprocedure Evaluation (Addendum)
Anesthesia Evaluation  Patient identified by MRN, date of birth, ID band Patient awake    Reviewed: Allergy & Precautions, NPO status , Patient's Chart, lab work & pertinent test results  Airway Mallampati: III  TM Distance: >3 FB Neck ROM: Full    Dental no notable dental hx. (+) Teeth Intact, Dental Advisory Given   Pulmonary sleep apnea    Pulmonary exam normal breath sounds clear to auscultation       Cardiovascular negative cardio ROS Normal cardiovascular exam Rhythm:Regular Rate:Normal     Neuro/Psych negative neurological ROS  negative psych ROS   GI/Hepatic negative GI ROS,,,Lab Results      Component                Value               Date                      ALT                      23                  04/26/2023                AST                      22                  04/26/2023                ALKPHOS                  79                  04/26/2023                BILITOT                  0.8                 04/26/2023              Endo/Other    Morbid obesity  Renal/GU Lab Results      Component                Value               Date                      NA                       136                 04/25/2023                CL                       99                  04/25/2023                K                        3.6  04/26/2023                CO2                      27                  04/25/2023                BUN                      15                  04/25/2023                CREATININE               0.99                04/26/2023                GFRNONAA                 >60                 04/26/2023                CALCIUM                  8.9                 04/25/2023                ALBUMIN                  3.2 (L)             04/26/2023                GLUCOSE                  126 (H)             04/25/2023                Musculoskeletal   Abdominal   Peds   Hematology Lab Results      Component                Value               Date                      WBC                      12.5 (H)            04/26/2023                HGB                      12.6 (L)            04/26/2023                HCT                      41.1                04/26/2023                MCV  88.0                04/26/2023                PLT                      195                 04/26/2023              Anesthesia Other Findings   Reproductive/Obstetrics                             Anesthesia Physical Anesthesia Plan  ASA: 3  Anesthesia Plan: General   Post-op Pain Management: Toradol IV (intra-op)* and Tylenol PO (pre-op)*   Induction: Intravenous  PONV Risk Score and Plan: 3 and Treatment may vary due to age or medical condition, Ondansetron, Midazolam and Dexamethasone  Airway Management Planned: Oral ETT and Video Laryngoscope Planned  Additional Equipment: None  Intra-op Plan:   Post-operative Plan: Extubation in OR  Informed Consent: I have reviewed the patients History and Physical, chart, labs and discussed the procedure including the risks, benefits and alternatives for the proposed anesthesia with the patient or authorized representative who has indicated his/her understanding and acceptance.     Dental advisory given  Plan Discussed with:   Anesthesia Plan Comments:        Anesthesia Quick Evaluation

## 2023-04-26 NOTE — H&P (View-Only) (Signed)
Assessment & Plan: Abdominal pain, cholelithiasis, chronic cholecystitis  NPO, IVF  Pain Rx  Plan to proceed with lap cholecystectomy this morning.  Discussed surgery with patient at the bedside.  Plan to proceed with cholecystectomy this morning.  Patient expresses understanding and agrees to proceed.        Darnell Level, MD [facility] practice Office: [phone]        Chief Complaint: Abdominal pain  Subjective: Patient in bed, comfortable.  Pain improved after Rx.  Wants to eat.  Objective: Vital signs in last 24 hours: Temp:  [98 F (36.7 C)-98.6 F (37 C)] 98.6 F (37 C) (08/16 0506) Pulse Rate:  [55-76] 76 (08/16 0506) Resp:  [16-28] 20 (08/16 0506) BP: (124-190)/(70-106) 124/70 (08/16 0506) SpO2:  [96 %-100 %] 96 % (08/16 0506) Last BM Date : 04/24/23  Intake/Output from previous day: 08/15 0701 - 08/16 0700 In: 1834 [I.V.:732.8; IV Piggyback:1101.3] Out: -  Intake/Output this shift: No intake/output data recorded.  Physical Exam: HEENT - sclerae clear, mucous membranes moist Neck - soft Abdomen - soft, obese; minimal tenderness, no mass  Lab Results:  Recent Labs    04/25/23 1225 04/26/23 0502  WBC 7.7 12.5*  HGB 14.2 12.6*  HCT 45.5 41.1  PLT 212 195   BMET Recent Labs    04/25/23 1225 04/26/23 0502  NA 136  --   K 3.7 3.6  CL 99  --   CO2 27  --   GLUCOSE 126*  --   BUN 15  --   CREATININE 1.12 0.99  CALCIUM 8.9  --    PT/INR Recent Labs    04/25/23 2102  LABPROT 14.2  INR 1.1   Comprehensive Metabolic Panel:    Component Value Date/Time   NA 136 04/25/2023 1225   NA 138 08/29/2012 0550   K 3.6 04/26/2023 0502   K 3.7 04/25/2023 1225   CL 99 04/25/2023 1225   CL 99 08/29/2012 0550   CO2 27 04/25/2023 1225   CO2 29 08/29/2012 0550   BUN 15 04/25/2023 1225   BUN 15 08/29/2012 0550   CREATININE 0.99 04/26/2023 0502   CREATININE 1.12 04/25/2023 1225   GLUCOSE 126 (H) 04/25/2023 1225    GLUCOSE 107 (H) 08/29/2012 0550   CALCIUM 8.9 04/25/2023 1225   CALCIUM 9.0 08/29/2012 0550   AST 22 04/26/2023 0502   AST 24 04/25/2023 1225   ALT 23 04/26/2023 0502   ALT 26 04/25/2023 1225   ALKPHOS 79 04/26/2023 0502   ALKPHOS 99 04/25/2023 1225   BILITOT 0.8 04/26/2023 0502   BILITOT 0.5 04/25/2023 1225   PROT 6.5 04/26/2023 0502   PROT 7.7 04/25/2023 1225   ALBUMIN 3.2 (L) 04/26/2023 0502   ALBUMIN 3.9 04/25/2023 1225    Studies/Results: DG Chest Port 1 View  Result Date: 04/26/2023 CLINICAL DATA:  Preoperative evaluation for upcoming cholecystectomy, initial encounter EXAM: PORTABLE CHEST 1 VIEW COMPARISON:  08/24/2012 FINDINGS: Cardiac shadow is enlarged but stable. Lungs are clear bilaterally. No focal infiltrate is noted. No bony abnormality is seen. IMPRESSION: No acute abnormality noted. Electronically Signed   By: Alcide Clever M.D.   On: 04/26/2023 03:22   CT ABDOMEN PELVIS W CONTRAST  Result Date: 04/25/2023 CLINICAL DATA:  Abdominal pain with nausea and vomiting starting last night. EXAM: CT ABDOMEN AND PELVIS WITH CONTRAST TECHNIQUE: Multidetector CT imaging of the abdomen and pelvis was performed using the standard protocol following bolus administration of intravenous contrast.  RADIATION DOSE REDUCTION: This exam was performed according to the departmental dose-optimization program which includes automated exposure control, adjustment of the mA and/or kV according to patient size and/or use of iterative reconstruction technique. CONTRAST:  OMNIPAQUE IOHEXOL 300 MG/ML  SOLN COMPARISON:  08/25/2012 FINDINGS: Lower chest: Upper normal heart size. 5 mm right middle lobe pulmonary nodule on image 11 series 4, previously measuring at mm on 08/25/2012, essentially stable. Likewise stable is a 4 mm posterior basal segment left lower lobe nodule on image 24 series 4. No further imaging workup of these lesions is indicated. Hepatobiliary: Suspected hepatic steatosis. 3.3 cm  gallstone in the gallbladder. Hazy stranding around the gallbladder borderline gallbladder wall thickening, cholecystitis is not excluded. A likely reactive porta hepatis lymph node adjacent to the gallbladder measures 0.1 cm in short axis on image 69 series 5, mildly prominent. Otherwise unremarkable. Pancreas: Unremarkable Spleen: Unremarkable Adrenals/Urinary Tract: 8 mm fluid density lesion of the left mid upper kidney compatible with Bosniak category 1 simple cyst. No further imaging workup of this lesion is indicated. Adrenal glands in urinary bladder unremarkable. Stomach/Bowel: Unremarkable Vascular/Lymphatic: 0.2 cm right external iliac lymph node on image 91 series 2, previously 0.6 cm. Right inguinal lymph node 1.4 cm in short axis on image 99 series 2, upper normal size, previously 0.9 cm. Additional upper normal bilateral inguinal lymph nodes are present. Reproductive: Unremarkable Other: No supplemental non-categorized findings. Musculoskeletal: Unremarkable IMPRESSION: 1. 3.3 cm gallstone in the gallbladder with borderline gallbladder wall thickening and hazy stranding around the gallbladder, cholecystitis is not excluded. Correlate with symptoms in determining whether further workup such as ultrasound or nuclear medicine hepatobiliary scan may be helpful. 2. Suspected hepatic steatosis. 3. Upper normal heart size. 4. Mildly prominent right external iliac node with upper normal sized inguinal lymph nodes. This appearance is nonspecific although these may be reactive. Electronically Signed   By: Gaylyn Rong M.D.   On: 04/25/2023 17:07      Darnell Level 04/26/2023  Patient ID: [name], male   DOB: 1975/12/07, 47 y.o.   MRN: [id number]

## 2023-04-26 NOTE — Interval H&P Note (Signed)
History and Physical Interval Note:  04/26/2023 10:54 AM  [name]  has presented today for surgery, with the diagnosis of cholecystitis.  The various methods of treatment have been discussed with the patient and family. After consideration of risks, benefits and other options for treatment, the patient has consented to    Procedure(s): LAPAROSCOPIC CHOLECYSTECTOMY WITH INTRAOPERATIVE CHOLANGIOGRAM (N/A) as a surgical intervention.    The patient's history has been reviewed, patient examined, no change in status, stable for surgery.  I have reviewed the patient's chart and labs.  Questions were answered to the patient's satisfaction.    Darnell Level, MD [facility] practice Office: [phone]   Darnell Level

## 2023-04-26 NOTE — Anesthesia Postprocedure Evaluation (Signed)
Anesthesia Post Note  Patient: [name]  Procedure(s) Performed: LAPAROSCOPIC CHOLECYSTECTOMY     Patient location during evaluation: PACU Anesthesia Type: General Level of consciousness: awake and alert Pain management: pain level controlled Vital Signs Assessment: post-procedure vital signs reviewed and stable Respiratory status: spontaneous breathing, nonlabored ventilation, respiratory function stable and patient connected to nasal cannula oxygen Cardiovascular status: blood pressure returned to baseline and stable Postop Assessment: no apparent nausea or vomiting Anesthetic complications: yes   Encounter Notable Events  Notable Event Outcome Phase Comment  Difficult to intubate - expected  Intraprocedure Filed from anesthesia note documentation.    Last Vitals:  Vitals:   04/26/23 1345 04/26/23 1400  BP: (!) 140/96 (!) 142/81  Pulse: 70 67  Resp: 18 17  Temp:    SpO2: 97% 96%    Last Pain:  Vitals:   04/26/23 1400  TempSrc:   PainSc: 0-No pain                 Trevor Iha

## 2023-04-26 NOTE — Progress Notes (Signed)
   04/26/23 1526  TOC Brief Assessment  Insurance and Status Reviewed  Patient has primary care physician No (Patient to call insurance to find in-network provider)  Home environment has been reviewed Home  Prior level of function: Independent  Prior/Current Home Services No current home services  Social Determinants of Health Reivew SDOH reviewed no interventions necessary  Readmission risk has been reviewed Yes  Transition of care needs no transition of care needs at this time

## 2023-04-26 NOTE — Transfer of Care (Signed)
Immediate Anesthesia Transfer of Care Note  Patient: [name]  Procedure(s) Performed: LAPAROSCOPIC CHOLECYSTECTOMY  Patient Location: PACU  Anesthesia Type:General  Level of Consciousness: awake and patient cooperative  Airway & Oxygen Therapy: Patient Spontanous Breathing and Patient connected to face mask  Post-op Assessment: Report given to RN and Post -op Vital signs reviewed and stable  Post vital signs: Reviewed and stable  Last Vitals:  Vitals Value Taken Time  BP 155/95 04/26/23 1330  Temp 36.6 C 04/26/23 1326  Pulse 73 04/26/23 1330  Resp 23 04/26/23 1330  SpO2 100 % 04/26/23 1330  Vitals [place] include unfiled device data.  Last Pain:  Vitals:   04/26/23 0946  TempSrc: Oral  PainSc: 0-No pain         Complications:  Encounter Notable Events  Notable Event Outcome Phase Comment  Difficult to intubate - expected  Intraprocedure Filed from anesthesia note documentation.

## 2023-04-26 NOTE — Anesthesia Procedure Notes (Signed)
Procedure Name: Intubation Date/Time: 04/26/2023 11:39 AM  Performed by: Vanessa Amarillo, CRNAPre-anesthesia Checklist: Emergency Drugs available, Suction available, Patient identified and Patient being monitored Patient Re-evaluated:Patient Re-evaluated prior to induction Oxygen Delivery Method: Circle system utilized Preoxygenation: Pre-oxygenation with 100% oxygen Induction Type: IV induction Ventilation: Mask ventilation without difficulty Laryngoscope Size: Glidescope and 4 Grade View: Grade I Tube type: Oral Tube size: 7.5 mm Number of attempts: 1 Airway Equipment and Method: Video-laryngoscopy Placement Confirmation: ETT inserted through vocal cords under direct vision, positive ETCO2 and breath sounds checked- equal and bilateral Secured at: 22 cm Tube secured with: Tape Dental Injury: Teeth and Oropharynx as per pre-operative assessment  Difficulty Due To: Difficulty was anticipated, Difficult Airway- due to large tongue, Difficult Airway- due to reduced neck mobility, Difficult Airway- due to limited oral opening and Difficult Airway- due to dentition Comments: Glidescope used due to expected difficulty

## 2023-04-26 NOTE — Discharge Instructions (Signed)

## 2023-04-26 NOTE — Progress Notes (Signed)
Assessment & Plan: Abdominal pain, cholelithiasis, chronic cholecystitis  NPO, IVF  Pain Rx  Plan to proceed with lap cholecystectomy this morning.  Discussed surgery with patient at the bedside.  Plan to proceed with cholecystectomy this morning.  Patient expresses understanding and agrees to proceed.        Darnell Level, MD [facility] practice Office: [phone]        Chief Complaint: Abdominal pain  Subjective: Patient in bed, comfortable.  Pain improved after Rx.  Wants to eat.  Objective: Vital signs in last 24 hours: Temp:  [98 F (36.7 C)-98.6 F (37 C)] 98.6 F (37 C) (08/16 0506) Pulse Rate:  [55-76] 76 (08/16 0506) Resp:  [16-28] 20 (08/16 0506) BP: (124-190)/(70-106) 124/70 (08/16 0506) SpO2:  [96 %-100 %] 96 % (08/16 0506) Last BM Date : 04/24/23  Intake/Output from previous day: 08/15 0701 - 08/16 0700 In: 1834 [I.V.:732.8; IV Piggyback:1101.3] Out: -  Intake/Output this shift: No intake/output data recorded.  Physical Exam: HEENT - sclerae clear, mucous membranes moist Neck - soft Abdomen - soft, obese; minimal tenderness, no mass  Lab Results:  Recent Labs    04/25/23 1225 04/26/23 0502  WBC 7.7 12.5*  HGB 14.2 12.6*  HCT 45.5 41.1  PLT 212 195   BMET Recent Labs    04/25/23 1225 04/26/23 0502  NA 136  --   K 3.7 3.6  CL 99  --   CO2 27  --   GLUCOSE 126*  --   BUN 15  --   CREATININE 1.12 0.99  CALCIUM 8.9  --    PT/INR Recent Labs    04/25/23 2102  LABPROT 14.2  INR 1.1   Comprehensive Metabolic Panel:    Component Value Date/Time   NA 136 04/25/2023 1225   NA 138 08/29/2012 0550   K 3.6 04/26/2023 0502   K 3.7 04/25/2023 1225   CL 99 04/25/2023 1225   CL 99 08/29/2012 0550   CO2 27 04/25/2023 1225   CO2 29 08/29/2012 0550   BUN 15 04/25/2023 1225   BUN 15 08/29/2012 0550   CREATININE 0.99 04/26/2023 0502   CREATININE 1.12 04/25/2023 1225   GLUCOSE 126 (H) 04/25/2023 1225    GLUCOSE 107 (H) 08/29/2012 0550   CALCIUM 8.9 04/25/2023 1225   CALCIUM 9.0 08/29/2012 0550   AST 22 04/26/2023 0502   AST 24 04/25/2023 1225   ALT 23 04/26/2023 0502   ALT 26 04/25/2023 1225   ALKPHOS 79 04/26/2023 0502   ALKPHOS 99 04/25/2023 1225   BILITOT 0.8 04/26/2023 0502   BILITOT 0.5 04/25/2023 1225   PROT 6.5 04/26/2023 0502   PROT 7.7 04/25/2023 1225   ALBUMIN 3.2 (L) 04/26/2023 0502   ALBUMIN 3.9 04/25/2023 1225    Studies/Results: DG Chest Port 1 View  Result Date: 04/26/2023 CLINICAL DATA:  Preoperative evaluation for upcoming cholecystectomy, initial encounter EXAM: PORTABLE CHEST 1 VIEW COMPARISON:  08/24/2012 FINDINGS: Cardiac shadow is enlarged but stable. Lungs are clear bilaterally. No focal infiltrate is noted. No bony abnormality is seen. IMPRESSION: No acute abnormality noted. Electronically Signed   By: Alcide Clever M.D.   On: 04/26/2023 03:22   CT ABDOMEN PELVIS W CONTRAST  Result Date: 04/25/2023 CLINICAL DATA:  Abdominal pain with nausea and vomiting starting last night. EXAM: CT ABDOMEN AND PELVIS WITH CONTRAST TECHNIQUE: Multidetector CT imaging of the abdomen and pelvis was performed using the standard protocol following bolus administration of intravenous contrast.  RADIATION DOSE REDUCTION: This exam was performed according to the departmental dose-optimization program which includes automated exposure control, adjustment of the mA and/or kV according to patient size and/or use of iterative reconstruction technique. CONTRAST:  OMNIPAQUE IOHEXOL 300 MG/ML  SOLN COMPARISON:  08/25/2012 FINDINGS: Lower chest: Upper normal heart size. 5 mm right middle lobe pulmonary nodule on image 11 series 4, previously measuring at mm on 08/25/2012, essentially stable. Likewise stable is a 4 mm posterior basal segment left lower lobe nodule on image 24 series 4. No further imaging workup of these lesions is indicated. Hepatobiliary: Suspected hepatic steatosis. 3.3 cm  gallstone in the gallbladder. Hazy stranding around the gallbladder borderline gallbladder wall thickening, cholecystitis is not excluded. A likely reactive porta hepatis lymph node adjacent to the gallbladder measures 0.1 cm in short axis on image 69 series 5, mildly prominent. Otherwise unremarkable. Pancreas: Unremarkable Spleen: Unremarkable Adrenals/Urinary Tract: 8 mm fluid density lesion of the left mid upper kidney compatible with Bosniak category 1 simple cyst. No further imaging workup of this lesion is indicated. Adrenal glands in urinary bladder unremarkable. Stomach/Bowel: Unremarkable Vascular/Lymphatic: 0.2 cm right external iliac lymph node on image 91 series 2, previously 0.6 cm. Right inguinal lymph node 1.4 cm in short axis on image 99 series 2, upper normal size, previously 0.9 cm. Additional upper normal bilateral inguinal lymph nodes are present. Reproductive: Unremarkable Other: No supplemental non-categorized findings. Musculoskeletal: Unremarkable IMPRESSION: 1. 3.3 cm gallstone in the gallbladder with borderline gallbladder wall thickening and hazy stranding around the gallbladder, cholecystitis is not excluded. Correlate with symptoms in determining whether further workup such as ultrasound or nuclear medicine hepatobiliary scan may be helpful. 2. Suspected hepatic steatosis. 3. Upper normal heart size. 4. Mildly prominent right external iliac node with upper normal sized inguinal lymph nodes. This appearance is nonspecific although these may be reactive. Electronically Signed   By: Gaylyn Rong M.D.   On: 04/25/2023 17:07      Darnell Level 04/26/2023  Patient ID: [name], male   DOB: 1975/12/07, 47 y.o.   MRN: [id number]

## 2023-04-27 ENCOUNTER — Other Ambulatory Visit: Payer: Self-pay

## 2023-04-27 ENCOUNTER — Encounter (HOSPITAL_COMMUNITY): Payer: Self-pay | Admitting: Surgery

## 2023-04-27 MED ORDER — IBUPROFEN 200 MG PO TABS
600.0000 mg | ORAL_TABLET | Freq: Three times a day (TID) | ORAL | Status: DC
  Administered 2023-04-27: 600 mg via ORAL
  Filled 2023-04-27: qty 3

## 2023-04-27 MED ORDER — ACETAMINOPHEN 500 MG PO TABS
1000.0000 mg | ORAL_TABLET | Freq: Four times a day (QID) | ORAL | Status: DC
  Administered 2023-04-27 (×2): 1000 mg via ORAL
  Filled 2023-04-27 (×2): qty 2

## 2023-04-27 MED ORDER — OXYCODONE HCL 5 MG PO TABS: 5.0000 mg | ORAL_TABLET | ORAL | Status: DC | PRN

## 2023-04-27 MED ORDER — HEPARIN SODIUM (PORCINE) 5000 UNIT/ML IJ SOLN: 5000.0000 [IU] | Freq: Three times a day (TID) | INTRAMUSCULAR | Status: DC

## 2023-04-27 MED ORDER — HYDROMORPHONE HCL 1 MG/ML IJ SOLN: 0.5000 mg | INTRAMUSCULAR | Status: DC | PRN

## 2023-04-27 NOTE — Plan of Care (Signed)

## 2023-04-27 NOTE — Progress Notes (Signed)
Patient discharged home with friend. Discharge instructions provided to patient and friend per request.

## 2023-04-27 NOTE — Discharge Summary (Signed)
Physician Discharge Summary  Patient ID: [name] MRN: [id number] DOB/AGE: [id number] y.o.  Admit date: 04/25/2023 Discharge date: 04/27/2023  Admission Diagnoses:  Chronic calculus cholecystitis Discharge Diagnoses: Same    Discharged Condition: good  Hospital Course: Admitted 04/25/23 with nausea, vomiting, abdominal pain.  Found to have symptomatic gallstones.  Laparoscopic cholecystectomy 04/26/23 by Dr. Gerrit Friends.  Felt better on POD #1.  Diet advanced and ready for discharge.    Treatments: surgery: laparoscopic cholecystectomy  Discharge Exam: Blood pressure 135/78, pulse 64, temperature 97.7 F (36.5 C), temperature source Oral, resp. rate 16, height 6\' 2"  (1.88 m), weight (!) 157.8 kg, SpO2 98%. WDWN in NAD Abd - soft, minimal tenderness;  Incisions c/d/I  Disposition: Discharge disposition: 01-Home or Self Care       Discharge Instructions     Call MD for:  persistant nausea and vomiting   Complete by: As directed    Call MD for:  redness, tenderness, or signs of infection (pain, swelling, redness, odor or green/yellow discharge around incision site)   Complete by: As directed    Call MD for:  severe uncontrolled pain   Complete by: As directed    Call MD for:  temperature >100.4   Complete by: As directed    Diet general   Complete by: As directed    Driving Restrictions   Complete by: As directed    Do not drive while taking pain medications   Increase activity slowly   Complete by: As directed    May shower / Bathe   Complete by: As directed       Allergies as of 04/27/2023   No Known Allergies      Medication List     STOP taking these medications    methocarbamol 500 MG tablet Commonly known as: ROBAXIN   oxyCODONE-acetaminophen 5-325 MG tablet Commonly known as: PERCOCET/ROXICET   penicillin v potassium 500 MG tablet Commonly known as: VEETID   warfarin 5 MG tablet Commonly known as: COUMADIN       TAKE these medications     oxyCODONE 5 MG immediate release tablet Commonly known as: Roxicodone Take 1 tablet (5 mg total) by mouth every 4 (four) hours as needed.        Follow-up Information     Maczis, Hedda Slade, PA-C Follow up on 05/14/2023.   Specialty: General Surgery Why: 9:30 am, Arrive 30 minutes prior to your appointment time, Please bring your insurance card and photo ID Contact information: [address] [facility] [place] [address] [facility] [place] [address] [phone]                 Signed: Wynona Luna 04/27/2023, 5:58 PM

## 2023-04-27 NOTE — Plan of Care (Signed)

## 2023-04-29 LAB — SURGICAL PATHOLOGY

## 2024-08-09 ENCOUNTER — Other Ambulatory Visit: Payer: Self-pay

## 2024-08-09 ENCOUNTER — Emergency Department (HOSPITAL_COMMUNITY): Admission: EM | Admit: 2024-08-09 | Discharge: 2024-08-09 | Disposition: A | Discharge status: Home / Self Care

## 2024-08-09 ENCOUNTER — Encounter (HOSPITAL_COMMUNITY): Payer: Self-pay

## 2024-08-09 DIAGNOSIS — K047 Periapical abscess without sinus: Secondary | ICD-10-CM | POA: Diagnosis not present

## 2024-08-09 DIAGNOSIS — R220 Localized swelling, mass and lump, head: Secondary | ICD-10-CM | POA: Diagnosis present

## 2024-08-09 DIAGNOSIS — K029 Dental caries, unspecified: Secondary | ICD-10-CM | POA: Insufficient documentation

## 2024-08-09 MED ORDER — AMOXICILLIN-POT CLAVULANATE 875-125 MG PO TABS
1.0000 | ORAL_TABLET | Freq: Once | ORAL | Status: AC
  Administered 2024-08-09: 1 via ORAL
  Filled 2024-08-09: qty 1

## 2024-08-09 MED ORDER — HYDROCODONE-ACETAMINOPHEN 5-325 MG PO TABS: 2.0000 | ORAL_TABLET | ORAL | 0 refills | Status: AC | PRN

## 2024-08-09 MED ORDER — AMOXICILLIN-POT CLAVULANATE 875-125 MG PO TABS: 1.0000 | ORAL_TABLET | Freq: Two times a day (BID) | ORAL | 0 refills | Status: AC

## 2024-08-09 MED ORDER — HYDROCODONE-ACETAMINOPHEN 5-325 MG PO TABS
1.0000 | ORAL_TABLET | Freq: Once | ORAL | Status: AC
  Administered 2024-08-09: 1 via ORAL
  Filled 2024-08-09: qty 1

## 2024-08-09 NOTE — ED Triage Notes (Signed)
 Possible tooth abscess, Right side external and internal swallowing per EMS.swelling onset Friday, pain onset Saturday. 126/72 bp 98%ra 108hr 107cbg

## 2024-08-09 NOTE — ED Provider Notes (Signed)
 [place] EMERGENCY DEPARTMENT AT [facility] Provider Note   CSN: [id number] Arrival date & time: 08/09/24  2044     Patient presents with: Abscess   [name] is a 48 y.o. male.  Presents today for possible tooth abscess.  Patient reports swelling began on Friday and pain began on Saturday.  Patient denies fever, chills, nausea, vomiting, any other complaints at this time.    Abscess      Prior to Admission medications   Medication Sig Start Date End Date Taking? Authorizing Provider  amoxicillin-clavulanate (AUGMENTIN) 875-125 MG tablet Take 1 tablet by mouth every 12 (twelve) hours. 08/09/24  Yes Marjon Doxtater N, PA-C  HYDROcodone-acetaminophen  (NORCO/VICODIN) 5-325 MG tablet Take 2 tablets by mouth every 4 (four) hours as needed. 08/09/24  Yes Francis Ileana SAILOR, PA-C    Allergies: Patient has no known allergies.    Review of Systems  HENT:  Positive for dental problem.     Updated Vital Signs BP (!) 182/97 (BP Location: Left Arm)   Pulse 77   Temp 98.1 F (36.7 C) (Oral)   SpO2 97%   Physical Exam Vitals and nursing note reviewed.  Constitutional:      General: He is not in acute distress.    Appearance: He is well-developed.  HENT:     Head: Normocephalic and atraumatic.     Right Ear: External ear normal.     Left Ear: External ear normal.     Mouth/Throat:     Mouth: Mucous membranes are moist. No angioedema.     Dentition: Abnormal dentition. Dental caries present. No dental abscesses.     Tongue: No lesions. Tongue does not deviate from midline.     Pharynx: Oropharynx is clear. Uvula midline. No pharyngeal swelling, oropharyngeal exudate, posterior oropharyngeal erythema or uvula swelling.     Tonsils: No tonsillar exudate.      Comments: Patient appears to have a dental infection to the tooth noted above with obvious dental carry.  Patient has diffuse soft tissue swelling of the adjacent tissue without obvious fluctuance or drainage  noted on exam.  No sublingual or submandibular swelling. Eyes:     Conjunctiva/sclera: Conjunctivae normal.  Cardiovascular:     Rate and Rhythm: Normal rate and regular rhythm.     Pulses: Normal pulses.  Pulmonary:     Effort: Pulmonary effort is normal. No respiratory distress.  Abdominal:     Palpations: Abdomen is soft.     Tenderness: There is no abdominal tenderness.  Musculoskeletal:        General: No swelling.     Cervical back: Neck supple.  Skin:    General: Skin is warm and dry.     Capillary Refill: Capillary refill takes less than 2 seconds.  Neurological:     General: No focal deficit present.     Mental Status: He is alert and oriented to person, place, and time.  Psychiatric:        Mood and Affect: Mood normal.     (all labs ordered are listed, but only abnormal results are displayed) Labs Reviewed - No data to display  EKG: None  Radiology: No results found.   Procedures   Medications Ordered in the ED  amoxicillin-clavulanate (AUGMENTIN) 875-125 MG per tablet 1 tablet (has no administration in time range)  HYDROcodone-acetaminophen  (NORCO/VICODIN) 5-325 MG per tablet 1 tablet (has no administration in time range)  Medical Decision Making Risk Prescription drug management.   This patient presents to the ED for concern of dental pain differential diagnosis includes dental fracture, dental caries, dental infection, abscess, PTA, Ludwig's angina   Additional history obtained   Additional history obtained from Electronic Medical Record External records from outside source obtained and reviewed including Care Everywhere  Medicines ordered and prescription drug management:  I ordered medication including Norco and Augmentin    I have reviewed the patients home medicines and have made adjustments as needed   Problem List / ED Course:  Considered for admission or further workup however patient's vital  signs and physical exam are reassuring.  Patient has no red flag signs or symptoms concerning for deep space infection, Ludwig's angina, systemic infection or PTA at this time patient's symptoms likely due to dental infection.  Patient started on antibiotics and given dental resources.  Patient given return precautions.  I feel patient is safe for discharge at this time.     Final diagnoses:  Dental infection    ED Discharge Orders          Ordered    amoxicillin-clavulanate (AUGMENTIN) 875-125 MG tablet  Every 12 hours        08/09/24 2230    HYDROcodone-acetaminophen  (NORCO/VICODIN) 5-325 MG tablet  Every 4 hours PRN        08/09/24 2230               Francis Ileana SAILOR, PA-C 08/09/24 2234    Neysa Caron PARAS, DO 08/09/24 2345

## 2024-08-09 NOTE — Discharge Instructions (Addendum)
 Today you were seen for a dental infection.  Please pick up your antibiotic and take as prescribed.  You may alternate Tylenol  and Motrin .  4 hours for mild to moderate pain.  You have been given a short course of Norco for severe pain.  Please follow-up with a dentist as soon as possible for further evaluation and workup.  Please return to the ED if you have fever that does not go down with Tylenol  or Motrin , worsening pain, or difficulty breathing.  Thank you for letting us  treat you today. After performing a physical exam, I feel you are safe to go home. Please follow up with your PCP in the next several days and provide them with your records from this visit. Return to the Emergency Room if pain becomes severe or symptoms worsen.  [facility] dental clinic -  [address] [place] - [place] [address] [phone]  [facility]) treats patients who are in the following groups:  Pediatrics with medicaid up to age 54 Patients over 27 with an orange card and a referral from a medical provider.  - If they are referred - there is a 40$ copay for any service - extration etc.  To get an orange card the following process is necessary.  Call the [facility] to apply for card - [phone] Once approved, you the patient will be set up with a medical provider Medical provider will refer patient to the [facility] clinic .  Offices accepting Medicaid patients:  Urgent tooth - [address] [place], [place] [address] [phone]  [facility] - [address] [place] [address] [phone]  [facility] (will see emergency dental patients from ED) - [address] [place], [place] [address] [phone]
